# Patient Record
Sex: Female | Born: 1970 | Race: White | Hispanic: No | Smoking: Never smoker
Health system: Southern US, Community
[De-identification: ages and names within clinical notes are randomized; demographics above are authoritative.]

## PROBLEM LIST (undated history)

## (undated) DIAGNOSIS — D649 Anemia, unspecified: Secondary | ICD-10-CM

## (undated) DIAGNOSIS — F329 Major depressive disorder, single episode, unspecified: Secondary | ICD-10-CM

## (undated) DIAGNOSIS — R112 Nausea with vomiting, unspecified: Secondary | ICD-10-CM

## (undated) DIAGNOSIS — F32A Depression, unspecified: Secondary | ICD-10-CM

## (undated) DIAGNOSIS — M1990 Unspecified osteoarthritis, unspecified site: Secondary | ICD-10-CM

## (undated) DIAGNOSIS — Z9889 Other specified postprocedural states: Secondary | ICD-10-CM

## (undated) DIAGNOSIS — I10 Essential (primary) hypertension: Secondary | ICD-10-CM

## (undated) HISTORY — PX: BREAST REDUCTION SURGERY: SHX8

---

## 1998-10-04 ENCOUNTER — Inpatient Hospital Stay (HOSPITAL_COMMUNITY): Admission: AD | Admit: 1998-10-04 | Discharge: 1998-10-06 | Payer: Self-pay | Admitting: *Deleted

## 1998-10-08 ENCOUNTER — Encounter (HOSPITAL_COMMUNITY): Admission: RE | Admit: 1998-10-08 | Discharge: 1999-01-06 | Payer: Self-pay | Admitting: *Deleted

## 1998-11-09 ENCOUNTER — Other Ambulatory Visit: Admission: RE | Admit: 1998-11-09 | Discharge: 1998-11-09 | Payer: Self-pay | Admitting: *Deleted

## 2000-01-30 ENCOUNTER — Other Ambulatory Visit: Admission: RE | Admit: 2000-01-30 | Discharge: 2000-01-30 | Payer: Self-pay | Admitting: *Deleted

## 2001-01-30 ENCOUNTER — Other Ambulatory Visit: Admission: RE | Admit: 2001-01-30 | Discharge: 2001-01-30 | Payer: Self-pay | Admitting: *Deleted

## 2003-05-03 ENCOUNTER — Other Ambulatory Visit: Admission: RE | Admit: 2003-05-03 | Discharge: 2003-05-03 | Payer: Self-pay | Admitting: Obstetrics and Gynecology

## 2009-03-23 ENCOUNTER — Encounter: Admission: RE | Admit: 2009-03-23 | Discharge: 2009-03-23 | Payer: Self-pay | Admitting: Obstetrics and Gynecology

## 2010-03-26 ENCOUNTER — Encounter: Admission: RE | Admit: 2010-03-26 | Discharge: 2010-03-26 | Payer: Self-pay | Admitting: Obstetrics and Gynecology

## 2011-02-26 ENCOUNTER — Other Ambulatory Visit: Payer: Self-pay | Admitting: Obstetrics and Gynecology

## 2011-02-26 DIAGNOSIS — Z1231 Encounter for screening mammogram for malignant neoplasm of breast: Secondary | ICD-10-CM

## 2011-03-28 ENCOUNTER — Ambulatory Visit
Admission: RE | Admit: 2011-03-28 | Discharge: 2011-03-28 | Disposition: A | Discharge status: Home / Self Care | Payer: 59 | Source: Ambulatory Visit | Attending: Obstetrics and Gynecology | Admitting: Obstetrics and Gynecology

## 2011-03-28 DIAGNOSIS — Z1231 Encounter for screening mammogram for malignant neoplasm of breast: Secondary | ICD-10-CM

## 2013-06-24 ENCOUNTER — Other Ambulatory Visit: Payer: Self-pay

## 2013-06-24 DIAGNOSIS — Z1231 Encounter for screening mammogram for malignant neoplasm of breast: Secondary | ICD-10-CM

## 2013-06-28 ENCOUNTER — Ambulatory Visit
Admission: RE | Admit: 2013-06-28 | Discharge: 2013-06-28 | Disposition: A | Discharge status: Home / Self Care | Payer: 59 | Source: Ambulatory Visit

## 2013-06-28 DIAGNOSIS — Z1231 Encounter for screening mammogram for malignant neoplasm of breast: Secondary | ICD-10-CM

## 2016-03-19 ENCOUNTER — Other Ambulatory Visit (INDEPENDENT_AMBULATORY_CARE_PROVIDER_SITE_OTHER): Payer: Self-pay | Admitting: Physician Assistant

## 2016-04-17 ENCOUNTER — Other Ambulatory Visit (INDEPENDENT_AMBULATORY_CARE_PROVIDER_SITE_OTHER): Payer: Self-pay | Admitting: Physician Assistant

## 2016-05-17 ENCOUNTER — Other Ambulatory Visit (INDEPENDENT_AMBULATORY_CARE_PROVIDER_SITE_OTHER): Payer: Self-pay | Admitting: Physician Assistant

## 2016-05-17 NOTE — Telephone Encounter (Signed)
Please advise 

## 2016-06-19 ENCOUNTER — Other Ambulatory Visit (INDEPENDENT_AMBULATORY_CARE_PROVIDER_SITE_OTHER): Payer: Self-pay | Admitting: Physician Assistant

## 2016-07-23 ENCOUNTER — Other Ambulatory Visit (INDEPENDENT_AMBULATORY_CARE_PROVIDER_SITE_OTHER): Payer: Self-pay | Admitting: Orthopaedic Surgery

## 2016-08-14 ENCOUNTER — Other Ambulatory Visit (INDEPENDENT_AMBULATORY_CARE_PROVIDER_SITE_OTHER): Payer: Self-pay | Admitting: Orthopaedic Surgery

## 2016-09-23 ENCOUNTER — Other Ambulatory Visit (INDEPENDENT_AMBULATORY_CARE_PROVIDER_SITE_OTHER): Payer: Self-pay | Admitting: Orthopaedic Surgery

## 2016-10-24 ENCOUNTER — Other Ambulatory Visit (INDEPENDENT_AMBULATORY_CARE_PROVIDER_SITE_OTHER): Payer: Self-pay | Admitting: Orthopaedic Surgery

## 2016-11-23 ENCOUNTER — Other Ambulatory Visit (INDEPENDENT_AMBULATORY_CARE_PROVIDER_SITE_OTHER): Payer: Self-pay | Admitting: Orthopaedic Surgery

## 2016-12-07 ENCOUNTER — Other Ambulatory Visit (INDEPENDENT_AMBULATORY_CARE_PROVIDER_SITE_OTHER): Payer: Self-pay | Admitting: Orthopaedic Surgery

## 2017-01-08 ENCOUNTER — Other Ambulatory Visit: Payer: Self-pay | Admitting: Obstetrics and Gynecology

## 2017-01-21 NOTE — Patient Instructions (Addendum)
Your procedure is scheduled on:  Monday, Sept. 10, 2018  Enter through the [employer] [facility] at: 11:30 AM  Pick up the phone at the desk and dial [phone].  Call this number if you have problems the morning of surgery: [phone].  Remember: Do NOT eat food:  After Midnight Sunday  Do NOT drink clear liquids after:  7:00 AM day of surgery  Take these medicines the morning of surgery with a SIP OF WATER:  None  Stop ALL herbal medications at this time  Do NOT smoke the day of surgery.  Do NOT wear jewelry (body piercing), metal hair clips/bobby pins, make-up, artifical eyelashes or nail polish. Do NOT wear lotions, powders, or perfumes.  You may wear deodorant. Do NOT shave for 48 hours prior to surgery. Do NOT bring valuables to the hospital. Contacts, dentures, or bridgework may not be worn into surgery.  Leave suitcase in car.  After surgery it may be brought to your room.  For patients admitted to the hospital, checkout time is 11:00 AM the day of discharge.  Bring a copy of your healthcare power of attorney and living will documents.

## 2017-01-22 ENCOUNTER — Encounter (HOSPITAL_COMMUNITY): Payer: Self-pay

## 2017-01-22 ENCOUNTER — Other Ambulatory Visit: Payer: Self-pay

## 2017-01-22 ENCOUNTER — Encounter (HOSPITAL_COMMUNITY)
Admission: RE | Admit: 2017-01-22 | Discharge: 2017-01-22 | Disposition: A | Discharge status: Home / Self Care | Payer: BLUE CROSS/BLUE SHIELD | Source: Ambulatory Visit | Attending: Obstetrics and Gynecology | Admitting: Obstetrics and Gynecology

## 2017-01-22 DIAGNOSIS — Z01818 Encounter for other preprocedural examination: Secondary | ICD-10-CM | POA: Diagnosis present

## 2017-01-22 HISTORY — DX: Anemia, unspecified: D64.9

## 2017-01-22 HISTORY — DX: Essential (primary) hypertension: I10

## 2017-01-22 HISTORY — DX: Unspecified osteoarthritis, unspecified site: M19.90

## 2017-01-22 HISTORY — DX: Other specified postprocedural states: R11.2

## 2017-01-22 HISTORY — DX: Depression, unspecified: F32.A

## 2017-01-22 HISTORY — DX: Other specified postprocedural states: Z98.890

## 2017-01-22 HISTORY — DX: Major depressive disorder, single episode, unspecified: F32.9

## 2017-01-22 LAB — CBC
HEMATOCRIT: 40.8 % (ref 36.0–46.0)
HEMOGLOBIN: 13.3 g/dL (ref 12.0–15.0)
MCH: 29.4 pg (ref 26.0–34.0)
MCHC: 32.6 g/dL (ref 30.0–36.0)
MCV: 90.1 fL (ref 78.0–100.0)
Platelets: 421 10*3/uL — ABNORMAL HIGH (ref 150–400)
RBC: 4.53 MIL/uL (ref 3.87–5.11)
RDW: 13.8 % (ref 11.5–15.5)
WBC: 7 10*3/uL (ref 4.0–10.5)

## 2017-01-22 LAB — TYPE AND SCREEN
ABO/RH(D): A POS
ANTIBODY SCREEN: NEGATIVE

## 2017-01-22 LAB — BASIC METABOLIC PANEL
ANION GAP: 10 (ref 5–15)
BUN: 17 mg/dL (ref 6–20)
CALCIUM: 9.2 mg/dL (ref 8.9–10.3)
CO2: 24 mmol/L (ref 22–32)
Chloride: 103 mmol/L (ref 101–111)
Creatinine, Ser: 0.8 mg/dL (ref 0.44–1.00)
GFR calc Af Amer: >60 mL/min (ref >60)
Glucose, Bld: 116 mg/dL — ABNORMAL HIGH (ref 65–99)
POTASSIUM: 4.8 mmol/L (ref 3.5–5.1)
SODIUM: 137 mmol/L (ref 135–145)

## 2017-01-22 LAB — ABO/RH: ABO/RH(D): A POS

## 2017-01-27 ENCOUNTER — Encounter (HOSPITAL_COMMUNITY)
Admission: AD | Disposition: A | Discharge status: Home / Self Care | Payer: Self-pay | Source: Ambulatory Visit | Attending: Obstetrics and Gynecology

## 2017-01-27 ENCOUNTER — Ambulatory Visit (HOSPITAL_COMMUNITY)
Admission: AD | Admit: 2017-01-27 | Discharge: 2017-01-28 | Disposition: A | Discharge status: Home / Self Care | Payer: BLUE CROSS/BLUE SHIELD | Source: Ambulatory Visit | Attending: Obstetrics and Gynecology | Admitting: Obstetrics and Gynecology

## 2017-01-27 ENCOUNTER — Ambulatory Visit (HOSPITAL_COMMUNITY): Payer: BLUE CROSS/BLUE SHIELD | Admitting: Anesthesiology

## 2017-01-27 ENCOUNTER — Encounter (HOSPITAL_COMMUNITY): Payer: Self-pay | Admitting: Anesthesiology

## 2017-01-27 DIAGNOSIS — Z888 Allergy status to other drugs, medicaments and biological substances status: Secondary | ICD-10-CM | POA: Diagnosis not present

## 2017-01-27 DIAGNOSIS — Z6841 Body Mass Index (BMI) 40.0 and over, adult: Secondary | ICD-10-CM | POA: Insufficient documentation

## 2017-01-27 DIAGNOSIS — N841 Polyp of cervix uteri: Secondary | ICD-10-CM | POA: Insufficient documentation

## 2017-01-27 DIAGNOSIS — D259 Leiomyoma of uterus, unspecified: Secondary | ICD-10-CM | POA: Insufficient documentation

## 2017-01-27 DIAGNOSIS — Z833 Family history of diabetes mellitus: Secondary | ICD-10-CM | POA: Insufficient documentation

## 2017-01-27 DIAGNOSIS — N920 Excessive and frequent menstruation with regular cycle: Secondary | ICD-10-CM | POA: Insufficient documentation

## 2017-01-27 DIAGNOSIS — Z79899 Other long term (current) drug therapy: Secondary | ICD-10-CM | POA: Insufficient documentation

## 2017-01-27 DIAGNOSIS — I10 Essential (primary) hypertension: Secondary | ICD-10-CM | POA: Insufficient documentation

## 2017-01-27 DIAGNOSIS — N888 Other specified noninflammatory disorders of cervix uteri: Secondary | ICD-10-CM | POA: Insufficient documentation

## 2017-01-27 DIAGNOSIS — Z8249 Family history of ischemic heart disease and other diseases of the circulatory system: Secondary | ICD-10-CM | POA: Insufficient documentation

## 2017-01-27 DIAGNOSIS — Z91048 Other nonmedicinal substance allergy status: Secondary | ICD-10-CM | POA: Insufficient documentation

## 2017-01-27 DIAGNOSIS — N736 Female pelvic peritoneal adhesions (postinfective): Secondary | ICD-10-CM | POA: Insufficient documentation

## 2017-01-27 DIAGNOSIS — N921 Excessive and frequent menstruation with irregular cycle: Secondary | ICD-10-CM | POA: Diagnosis present

## 2017-01-27 DIAGNOSIS — K469 Unspecified abdominal hernia without obstruction or gangrene: Secondary | ICD-10-CM | POA: Diagnosis not present

## 2017-01-27 HISTORY — PX: ROBOTIC ASSISTED TOTAL HYSTERECTOMY WITH SALPINGECTOMY: SHX6679

## 2017-01-27 SURGERY — ROBOTIC ASSISTED TOTAL HYSTERECTOMY WITH SALPINGECTOMY
Anesthesia: General | Site: Abdomen | Laterality: Bilateral

## 2017-01-27 MED ORDER — BUPIVACAINE HCL (PF) 0.25 % IJ SOLN
INTRAMUSCULAR | Status: AC
Start: 2017-01-27 — End: 2017-01-27
  Filled 2017-01-27: qty 30

## 2017-01-27 MED ORDER — LOSARTAN POTASSIUM 50 MG PO TABS
50.0000 mg | ORAL_TABLET | Freq: Every day | ORAL | Status: DC
  Administered 2017-01-27: 50 mg via ORAL
  Filled 2017-01-27: qty 1

## 2017-01-27 MED ORDER — SCOPOLAMINE 1 MG/3DAYS TD PT72
MEDICATED_PATCH | TRANSDERMAL | Status: AC
  Administered 2017-01-27: 1.5 mg via TRANSDERMAL
  Filled 2017-01-27: qty 1

## 2017-01-27 MED ORDER — MIDAZOLAM HCL 2 MG/2ML IJ SOLN
INTRAMUSCULAR | Status: DC | PRN
  Administered 2017-01-27: 2 mg via INTRAVENOUS

## 2017-01-27 MED ORDER — HYDROMORPHONE HCL 1 MG/ML IJ SOLN
INTRAMUSCULAR | Status: DC | PRN
  Administered 2017-01-27: 1 mg via INTRAVENOUS

## 2017-01-27 MED ORDER — LIDOCAINE HCL (CARDIAC) 20 MG/ML IV SOLN
INTRAVENOUS | Status: DC | PRN
  Administered 2017-01-27: 100 mg via INTRAVENOUS

## 2017-01-27 MED ORDER — ROCURONIUM BROMIDE 100 MG/10ML IV SOLN
INTRAVENOUS | Status: DC | PRN
  Administered 2017-01-27: 10 mg via INTRAVENOUS
  Administered 2017-01-27: 50 mg via INTRAVENOUS
  Administered 2017-01-27: 10 mg via INTRAVENOUS

## 2017-01-27 MED ORDER — LIDOCAINE HCL (CARDIAC) 20 MG/ML IV SOLN
INTRAVENOUS | Status: AC
  Filled 2017-01-27: qty 5

## 2017-01-27 MED ORDER — HYDROMORPHONE 1 MG/ML IV SOLN
INTRAVENOUS | Status: DC
  Administered 2017-01-27: 17:00:00 via INTRAVENOUS
  Administered 2017-01-27: 0.6 mg via INTRAVENOUS
  Administered 2017-01-28: 0.4 mg via INTRAVENOUS
  Filled 2017-01-27: qty 25

## 2017-01-27 MED ORDER — ACETAMINOPHEN 160 MG/5ML PO SOLN
950.0000 mg | Freq: Once | ORAL | Status: AC
  Administered 2017-01-27: 950 mg via ORAL

## 2017-01-27 MED ORDER — EPHEDRINE SULFATE 50 MG/ML IJ SOLN
INTRAMUSCULAR | Status: DC | PRN
  Administered 2017-01-27 (×2): 5 mg via INTRAVENOUS

## 2017-01-27 MED ORDER — METOCLOPRAMIDE HCL 5 MG/ML IJ SOLN
INTRAMUSCULAR | Status: DC | PRN
  Administered 2017-01-27: 10 mg via INTRAVENOUS

## 2017-01-27 MED ORDER — CEFAZOLIN SODIUM-DEXTROSE 2-4 GM/100ML-% IV SOLN
2.0000 g | INTRAVENOUS | Status: AC
  Administered 2017-01-27: 2 g via INTRAVENOUS

## 2017-01-27 MED ORDER — DEXAMETHASONE SODIUM PHOSPHATE 10 MG/ML IJ SOLN
INTRAMUSCULAR | Status: AC
  Filled 2017-01-27: qty 1

## 2017-01-27 MED ORDER — CEFAZOLIN SODIUM-DEXTROSE 2-4 GM/100ML-% IV SOLN
INTRAVENOUS | Status: AC
Start: 2017-01-27 — End: 2017-01-27
  Filled 2017-01-27: qty 100

## 2017-01-27 MED ORDER — MIDAZOLAM HCL 2 MG/2ML IJ SOLN
INTRAMUSCULAR | Status: AC
  Filled 2017-01-27: qty 2

## 2017-01-27 MED ORDER — PHENYLEPHRINE 40 MCG/ML (10ML) SYRINGE FOR IV PUSH (FOR BLOOD PRESSURE SUPPORT)
PREFILLED_SYRINGE | INTRAVENOUS | Status: AC
  Filled 2017-01-27: qty 10

## 2017-01-27 MED ORDER — LACTATED RINGERS IV SOLN
INTRAVENOUS | Status: DC
  Administered 2017-01-27 (×2): via INTRAVENOUS

## 2017-01-27 MED ORDER — SERTRALINE HCL 100 MG PO TABS
100.0000 mg | ORAL_TABLET | Freq: Every day | ORAL | Status: DC
  Administered 2017-01-27: 100 mg via ORAL
  Filled 2017-01-27: qty 1

## 2017-01-27 MED ORDER — DEXTROSE IN LACTATED RINGERS 5 % IV SOLN
INTRAVENOUS | Status: DC
  Administered 2017-01-27: 17:00:00 via INTRAVENOUS

## 2017-01-27 MED ORDER — SUGAMMADEX SODIUM 200 MG/2ML IV SOLN
INTRAVENOUS | Status: AC
  Filled 2017-01-27: qty 2

## 2017-01-27 MED ORDER — PHENYLEPHRINE HCL 10 MG/ML IJ SOLN
INTRAMUSCULAR | Status: DC | PRN
  Administered 2017-01-27: 40 ug via INTRAVENOUS
  Administered 2017-01-27: 80 ug via INTRAVENOUS
  Administered 2017-01-27: 40 ug via INTRAVENOUS
  Administered 2017-01-27: 80 ug via INTRAVENOUS
  Administered 2017-01-27 (×2): 40 ug via INTRAVENOUS
  Administered 2017-01-27 (×2): 80 ug via INTRAVENOUS

## 2017-01-27 MED ORDER — SODIUM CHLORIDE 0.9 % IV SOLN
INTRAVENOUS | Status: DC | PRN
  Administered 2017-01-27: 60 mL
  Administered 2017-01-27: 14:00:00

## 2017-01-27 MED ORDER — ACETAMINOPHEN 160 MG/5ML PO SOLN
ORAL | Status: AC
  Filled 2017-01-27: qty 40.6

## 2017-01-27 MED ORDER — ATROPINE SULFATE 0.4 MG/ML IJ SOLN
INTRAMUSCULAR | Status: AC
  Filled 2017-01-27: qty 1

## 2017-01-27 MED ORDER — SCOPOLAMINE 1 MG/3DAYS TD PT72
1.0000 | MEDICATED_PATCH | Freq: Once | TRANSDERMAL | Status: DC
  Administered 2017-01-27: 1.5 mg via TRANSDERMAL

## 2017-01-27 MED ORDER — SODIUM CHLORIDE 0.9 % IJ SOLN
INTRAMUSCULAR | Status: AC
  Filled 2017-01-27: qty 40

## 2017-01-27 MED ORDER — GLYCOPYRROLATE 0.2 MG/ML IJ SOLN
INTRAMUSCULAR | Status: DC | PRN
  Administered 2017-01-27: .05 mg via INTRAVENOUS

## 2017-01-27 MED ORDER — DEXAMETHASONE SODIUM PHOSPHATE 4 MG/ML IJ SOLN
INTRAMUSCULAR | Status: DC | PRN
  Administered 2017-01-27: 10 mg via INTRAVENOUS

## 2017-01-27 MED ORDER — FENTANYL CITRATE (PF) 250 MCG/5ML IJ SOLN
INTRAMUSCULAR | Status: AC
  Filled 2017-01-27: qty 5

## 2017-01-27 MED ORDER — DIPHENHYDRAMINE HCL 12.5 MG/5ML PO ELIX: 12.5000 mg | ORAL_SOLUTION | Freq: Four times a day (QID) | ORAL | Status: DC | PRN

## 2017-01-27 MED ORDER — ONDANSETRON HCL 4 MG/2ML IJ SOLN
INTRAMUSCULAR | Status: AC
  Filled 2017-01-27: qty 2

## 2017-01-27 MED ORDER — HYDROMORPHONE HCL 1 MG/ML IJ SOLN
INTRAMUSCULAR | Status: AC
  Filled 2017-01-27: qty 1

## 2017-01-27 MED ORDER — DIPHENHYDRAMINE HCL 50 MG/ML IJ SOLN: 12.5000 mg | Freq: Four times a day (QID) | INTRAMUSCULAR | Status: DC | PRN

## 2017-01-27 MED ORDER — ROPIVACAINE HCL 5 MG/ML IJ SOLN
INTRAMUSCULAR | Status: AC
  Filled 2017-01-27: qty 30

## 2017-01-27 MED ORDER — EPHEDRINE 5 MG/ML INJ
INTRAVENOUS | Status: AC
  Filled 2017-01-27: qty 10

## 2017-01-27 MED ORDER — ONDANSETRON HCL 4 MG/2ML IJ SOLN: 4.0000 mg | Freq: Four times a day (QID) | INTRAMUSCULAR | Status: DC | PRN

## 2017-01-27 MED ORDER — FENTANYL CITRATE (PF) 250 MCG/5ML IJ SOLN
INTRAMUSCULAR | Status: DC | PRN
  Administered 2017-01-27 (×5): 50 ug via INTRAVENOUS

## 2017-01-27 MED ORDER — SUGAMMADEX SODIUM 200 MG/2ML IV SOLN
INTRAVENOUS | Status: DC | PRN
  Administered 2017-01-27: 230 mg via INTRAVENOUS

## 2017-01-27 MED ORDER — PROPOFOL 10 MG/ML IV BOLUS
INTRAVENOUS | Status: AC
  Filled 2017-01-27: qty 40

## 2017-01-27 MED ORDER — OXYCODONE-ACETAMINOPHEN 5-325 MG PO TABS
1.0000 | ORAL_TABLET | ORAL | Status: DC | PRN
  Administered 2017-01-28: 2 via ORAL
  Filled 2017-01-27: qty 2

## 2017-01-27 MED ORDER — TRAMADOL HCL 50 MG PO TABS: 50.0000 mg | ORAL_TABLET | Freq: Four times a day (QID) | ORAL | Status: DC | PRN

## 2017-01-27 MED ORDER — BUPIVACAINE HCL (PF) 0.25 % IJ SOLN
INTRAMUSCULAR | Status: DC | PRN
  Administered 2017-01-27: 10 mL

## 2017-01-27 MED ORDER — ONDANSETRON HCL 4 MG/2ML IJ SOLN
INTRAMUSCULAR | Status: DC | PRN
Start: 2017-01-27 — End: 2017-01-27
  Administered 2017-01-27: 4 mg via INTRAVENOUS

## 2017-01-27 MED ORDER — SODIUM CHLORIDE 0.9% FLUSH: 9.0000 mL | INTRAVENOUS | Status: DC | PRN

## 2017-01-27 MED ORDER — FENTANYL CITRATE (PF) 100 MCG/2ML IJ SOLN: 25.0000 ug | INTRAMUSCULAR | Status: DC | PRN

## 2017-01-27 MED ORDER — PROPOFOL 10 MG/ML IV BOLUS
INTRAVENOUS | Status: DC | PRN
  Administered 2017-01-27: 30 mg via INTRAVENOUS
  Administered 2017-01-27: 200 mg via INTRAVENOUS

## 2017-01-27 MED ORDER — NALOXONE HCL 0.4 MG/ML IJ SOLN: 0.4000 mg | INTRAMUSCULAR | Status: DC | PRN

## 2017-01-27 SURGICAL SUPPLY — 53 items
BARRIER ADHS 3X4 INTERCEED (GAUZE/BANDAGES/DRESSINGS) IMPLANT
CATH FOLEY 3WAY  5CC 16FR (CATHETERS) ×1
CATH FOLEY 3WAY 5CC 16FR (CATHETERS) ×1 IMPLANT
CLOTH BEACON ORANGE TIMEOUT ST (SAFETY) ×2 IMPLANT
CONT PATH 16OZ SNAP LID 3702 (MISCELLANEOUS) ×2 IMPLANT
COVER BACK TABLE 60X90IN (DRAPES) ×4 IMPLANT
COVER TIP SHEARS 8 DVNC (MISCELLANEOUS) ×1 IMPLANT
COVER TIP SHEARS 8MM DA VINCI (MISCELLANEOUS) ×1
DECANTER SPIKE VIAL GLASS SM (MISCELLANEOUS) ×6 IMPLANT
DERMABOND ADVANCED (GAUZE/BANDAGES/DRESSINGS) ×1
DERMABOND ADVANCED .7 DNX12 (GAUZE/BANDAGES/DRESSINGS) ×1 IMPLANT
DURAPREP 26ML APPLICATOR (WOUND CARE) ×2 IMPLANT
ELECT REM PT RETURN 9FT ADLT (ELECTROSURGICAL) ×2
ELECTRODE REM PT RTRN 9FT ADLT (ELECTROSURGICAL) ×1 IMPLANT
GAUZE VASELINE 3X9 (GAUZE/BANDAGES/DRESSINGS) IMPLANT
GLOVE BIO SURGEON STRL SZ7.5 (GLOVE) ×6 IMPLANT
GLOVE BIOGEL PI IND STRL 7.0 (GLOVE) ×2 IMPLANT
GLOVE BIOGEL PI INDICATOR 7.0 (GLOVE) ×2
KIT ACCESSORY DA VINCI DISP (KITS) ×1
KIT ACCESSORY DVNC DISP (KITS) ×1 IMPLANT
LEGGING LITHOTOMY PAIR STRL (DRAPES) ×2 IMPLANT
NEEDLE INSUFFLATION 150MM (ENDOMECHANICALS) ×2 IMPLANT
OCCLUDER COLPOPNEUMO (BALLOONS) ×2 IMPLANT
PACK ROBOT WH (CUSTOM PROCEDURE TRAY) ×2 IMPLANT
PACK ROBOTIC GOWN (GOWN DISPOSABLE) ×2 IMPLANT
PACK TRENDGUARD 450 HYBRID PRO (MISCELLANEOUS) IMPLANT
PACK TRENDGUARD 600 HYBRD PROC (MISCELLANEOUS) ×1 IMPLANT
PAD PREP 24X48 CUFFED NSTRL (MISCELLANEOUS) ×2 IMPLANT
POUCH LAPAROSCOPIC INSTRUMENT (MISCELLANEOUS) ×2 IMPLANT
PROTECTOR NERVE ULNAR (MISCELLANEOUS) ×4 IMPLANT
SET CYSTO W/LG BORE CLAMP LF (SET/KITS/TRAYS/PACK) IMPLANT
SET IRRIG TUBING LAPAROSCOPIC (IRRIGATION / IRRIGATOR) ×2 IMPLANT
SET TRI-LUMEN FLTR TB AIRSEAL (TUBING) ×2 IMPLANT
SUT VIC AB 0 CT1 27 (SUTURE) ×2
SUT VIC AB 0 CT1 27XBRD ANBCTR (SUTURE) ×2 IMPLANT
SUT VICRYL 0 UR6 27IN ABS (SUTURE) ×2 IMPLANT
SUT VICRYL RAPIDE 4/0 PS 2 (SUTURE) ×4 IMPLANT
SUT VLOC 180 0 9IN  GS21 (SUTURE) ×1
SUT VLOC 180 0 9IN GS21 (SUTURE) ×1 IMPLANT
SYR 50ML LL SCALE MARK (SYRINGE) ×2 IMPLANT
SYRINGE 10CC LL (SYRINGE) ×4 IMPLANT
TIP RUMI ORANGE 6.7MMX12CM (TIP) IMPLANT
TIP UTERINE 5.1X6CM LAV DISP (MISCELLANEOUS) IMPLANT
TIP UTERINE 6.7X10CM GRN DISP (MISCELLANEOUS) ×2 IMPLANT
TIP UTERINE 6.7X6CM WHT DISP (MISCELLANEOUS) IMPLANT
TIP UTERINE 6.7X8CM BLUE DISP (MISCELLANEOUS) IMPLANT
TOWEL OR 17X24 6PK STRL BLUE (TOWEL DISPOSABLE) ×6 IMPLANT
TRENDGUARD 450 HYBRID PRO PACK (MISCELLANEOUS)
TRENDGUARD 600 HYBRID PROC PK (MISCELLANEOUS) ×2
TROCAR DISP BLADELESS 8 DVNC (TROCAR) ×1 IMPLANT
TROCAR DISP BLADELESS 8MM (TROCAR) ×1
TROCAR PORT AIRSEAL 5X120 (TROCAR) ×2 IMPLANT
TROCAR Z-THREAD 12X150 (TROCAR) ×2 IMPLANT

## 2017-01-27 NOTE — Anesthesia Procedure Notes (Signed)
Procedure Name: Intubation Date/Time: 01/27/2017 12:52 PM Performed by: Flossie Dibble Pre-anesthesia Checklist: Patient identified, Patient being monitored, Timeout performed, Emergency Drugs available and Suction available Patient Re-evaluated:Patient Re-evaluated prior to induction Oxygen Delivery Method: Circle System Utilized Preoxygenation: Pre-oxygenation with 100% oxygen Induction Type: IV induction Ventilation: Mask ventilation without difficulty and Oral airway inserted - appropriate to patient size Laryngoscope Size: Mac and 3 Grade View: Grade I Tube type: Oral Tube size: 7.0 mm Number of attempts: 1 Airway Equipment and Method: stylet and Stylet Placement Confirmation: ETT inserted through vocal cords under direct vision,  positive ETCO2 and breath sounds checked- equal and bilateral Secured at: 21 cm Tube secured with: Tape Dental Injury: Teeth and Oropharynx as per pre-operative assessment

## 2017-01-27 NOTE — Transfer of Care (Signed)
Immediate Anesthesia Transfer of Care Note  Patient: Alison Mccarthy  Procedure(s) Performed: Procedure(s): ROBOTIC ASSISTED TOTAL HYSTERECTOMY WITH SALPINGECTOMY (Bilateral)  Patient Location: PACU  Anesthesia Type:General  Level of Consciousness: awake, alert  and oriented  Airway & Oxygen Therapy: Patient Spontanous Breathing and Patient connected to nasal cannula oxygen  Post-op Assessment: Report given to RN and Post -op Vital signs reviewed and stable  Post vital signs: Reviewed and stable  Last Vitals:  Vitals:   01/27/17 1153  BP: 130/88  Pulse: 86  Resp: 16  SpO2: 98%    Last Pain: There were no vitals filed for this visit.       Complications: No apparent anesthesia complications

## 2017-01-27 NOTE — Anesthesia Preprocedure Evaluation (Signed)
Anesthesia Evaluation  Patient identified by MRN, date of birth, ID band Patient awake    Reviewed: Allergy & Precautions, H&P , Patient's Chart, lab work & pertinent test results, reviewed documented beta blocker date and time   Airway Mallampati: II  TM Distance: >3 FB Neck ROM: full    Dental no notable dental hx.    Pulmonary    Pulmonary exam normal breath sounds clear to auscultation       Cardiovascular hypertension,  Rhythm:regular Rate:Normal     Neuro/Psych    GI/Hepatic   Endo/Other  Morbid obesity  Renal/GU      Musculoskeletal   Abdominal   Peds  Hematology   Anesthesia Other Findings   Reproductive/Obstetrics                             Anesthesia Physical Anesthesia Plan  ASA: III  Anesthesia Plan: General   Post-op Pain Management:    Induction: Intravenous  PONV Risk Score and Plan: 3 and Ondansetron, Dexamethasone, Midazolam and Scopolamine patch - Pre-op  Airway Management Planned: Oral ETT  Additional Equipment:   Intra-op Plan:   Post-operative Plan: Extubation in OR  Informed Consent: I have reviewed the patients History and Physical, chart, labs and discussed the procedure including the risks, benefits and alternatives for the proposed anesthesia with the patient or authorized representative who has indicated his/her understanding and acceptance.   Dental Advisory Given  Plan Discussed with: CRNA and Surgeon  Anesthesia Plan Comments: (  )        Anesthesia Quick Evaluation

## 2017-01-27 NOTE — H&P (Signed)
Alison Mccarthy, Alison Mccarthy                 ACCOUNT NO.:  [id number]  MEDICAL RECORD NO.:  [id number]  LOCATION:                                 FACILITY:  PHYSICIAN:  Lovenia Kim, M.D.     DATE OF BIRTH:  DATE OF ADMISSION:  01/27/2017 DATE OF DISCHARGE:                             HISTORY & PHYSICAL   CHIEF COMPLAINT:  Symptomatic fibroids.  HISTORY OF PRESENT ILLNESS:  This is a 46 year old white female G1, P0, with symptomatic fibroids, for definitive therapy.  MEDICATIONS:  Zoloft, losartan, vitamin, and Singulair as needed.  ALLERGIES:  To [place] Jelly, adhesive tape.  SOCIAL HISTORY:  Nonsmoker, nondrinker.  Denies domestic or physical violence.  FAMILY HISTORY:  Diabetes, chronic hypertension.  Personal surgical history of a breast reduction.  PHYSICAL EXAMINATION:  GENERAL:  Well-developed, well-nourished white female, in no acute distress. HEENT:  Normal. NECK:  Supple.  Full range of motion. LUNGS:  Clear. HEART:  Regular rate and rhythm. ABDOMEN:  Soft, nontender. PELVIC:  Bulky irregularly shaped anteflexed uterus and no adnexal masses. EXTREMITIES:  There are no cords. NEUROLOGIC:  Nonfocal. SKIN:  Intact.  IMPRESSION:  Symptomatic fibroids with menorrhagia, normal labs, history of hypertension.  Noncandidate for nonsurgical management.  The patient desires definitive therapy.  PLAN:  Da Vinci assisted total laparoscopic hysterectomy, bilateral salpingectomy.  Risks of anesthesia, infection, bleeding, injury to surrounding organs, and possible need for repair was discussed.  Delayed versus immediate complications to include bowel and bladder injury are noted.  The patient acknowledges and wishes to proceed.     Lovenia Kim, M.D.   ______________________________ Lovenia Kim, M.D.    RJT/MEDQ  D:  01/26/2017  T:  01/26/2017  Job:  [id number]  cc:   Lovenia Kim, M.D. Fax: [phone]

## 2017-01-27 NOTE — Anesthesia Postprocedure Evaluation (Signed)
Anesthesia Post Note  Patient: Alison Mccarthy  Procedure(s) Performed: Procedure(s) (LRB): ROBOTIC ASSISTED TOTAL HYSTERECTOMY WITH SALPINGECTOMY (Bilateral)     Patient location during evaluation: PACU Anesthesia Type: General Level of consciousness: awake and alert Pain management: pain level controlled Vital Signs Assessment: post-procedure vital signs reviewed and stable Respiratory status: spontaneous breathing, nonlabored ventilation, respiratory function stable and patient connected to nasal cannula oxygen Cardiovascular status: blood pressure returned to baseline and stable Postop Assessment: no signs of nausea or vomiting Anesthetic complications: no    Last Vitals:  Vitals:   01/27/17 1633 01/27/17 1650  BP:  (!) 103/52  Pulse: 89 91  Resp: 17 16  Temp: 37.1 C 36.9 C  SpO2: 99% 99%    Last Pain:  Vitals:   01/27/17 1650  TempSrc:   PainSc: 2    Pain Goal: Patients Stated Pain Goal: 5 (01/27/17 1650)               Arlando Leisinger EDWARD

## 2017-01-27 NOTE — Op Note (Signed)
01/27/2017  2:59 PM  PATIENT:  Alison Mccarthy  46 y.o. female  PRE-OPERATIVE DIAGNOSIS:  Fibroids, Menorrhagia  POST-OPERATIVE DIAGNOSIS:  Fibroids, Menorrhagia Enterocele Left adnexal adhesions  PROCEDURE:  Procedure(s): ROBOTIC ASSISTED TOTAL HYSTERECTOMY  BILATERAL SALPINGECTOMY MCCALL CUL DE PLASTY LYSIS OF ADHESIONS  SURGEON:  Surgeon(s): Brien Few, MD  ASSISTANTSMurrell Redden, MD  ANESTHESIA:   GENERAL, LOCAL  ESTIMATED BLOOD LOSS: 50  DRAINS: Urinary Catheter (Foley)   LOCAL MEDICATIONS USED:  MARCAINE    and Amount: 30 ml  SPECIMEN:  Source of Specimen:  UTERUS, CERVIX  DISPOSITION OF SPECIMEN:  PATHOLOGY  COUNTS:  YES  DICTATION [id number]  PLAN OF CARE: DC IN AM  PATIENT DISPOSITION:  PACU - hemodynamically stable.

## 2017-01-27 NOTE — Progress Notes (Signed)
Patient ID: Alison Mccarthy, female   DOB: [birth year], 46 y.o.   MRN: [id number] Patient seen and examined. Consent witnessed and signed. No changes noted. Update completed.

## 2017-01-27 NOTE — Progress Notes (Signed)
Review chart for  Chart audit.

## 2017-01-28 ENCOUNTER — Other Ambulatory Visit: Payer: Self-pay | Admitting: Obstetrics and Gynecology

## 2017-01-28 ENCOUNTER — Encounter (HOSPITAL_COMMUNITY): Payer: Self-pay | Admitting: Obstetrics and Gynecology

## 2017-01-28 DIAGNOSIS — D259 Leiomyoma of uterus, unspecified: Secondary | ICD-10-CM | POA: Diagnosis not present

## 2017-01-28 LAB — CBC
HEMATOCRIT: 35.3 % — AB (ref 36.0–46.0)
Hemoglobin: 11.6 g/dL — ABNORMAL LOW (ref 12.0–15.0)
MCH: 29.5 pg (ref 26.0–34.0)
MCHC: 32.9 g/dL (ref 30.0–36.0)
MCV: 89.8 fL (ref 78.0–100.0)
Platelets: 422 10*3/uL — ABNORMAL HIGH (ref 150–400)
RBC: 3.93 MIL/uL (ref 3.87–5.11)
RDW: 13.8 % (ref 11.5–15.5)
WBC: 10.3 10*3/uL (ref 4.0–10.5)

## 2017-01-28 LAB — BASIC METABOLIC PANEL
ANION GAP: 8 (ref 5–15)
BUN: 8 mg/dL (ref 6–20)
CALCIUM: 8.6 mg/dL — AB (ref 8.9–10.3)
CO2: 26 mmol/L (ref 22–32)
Chloride: 101 mmol/L (ref 101–111)
Creatinine, Ser: 0.77 mg/dL (ref 0.44–1.00)
GFR calc Af Amer: >60 mL/min (ref >60)
GFR calc non Af Amer: >60 mL/min (ref >60)
Glucose, Bld: 142 mg/dL — ABNORMAL HIGH (ref 65–99)
POTASSIUM: 4.1 mmol/L (ref 3.5–5.1)
Sodium: 135 mmol/L (ref 135–145)

## 2017-01-28 MED ORDER — TRAMADOL HCL 50 MG PO TABS: 50.0000 mg | ORAL_TABLET | Freq: Four times a day (QID) | ORAL | 0 refills | Status: DC | PRN

## 2017-01-28 MED ORDER — OXYCODONE-ACETAMINOPHEN 5-325 MG PO TABS: 1.0000 | ORAL_TABLET | ORAL | 0 refills | Status: DC | PRN

## 2017-01-28 NOTE — Addendum Note (Signed)
Addendum  created 01/28/17 9233 by Jonna Munro, CRNA   Sign clinical note

## 2017-01-28 NOTE — Anesthesia Postprocedure Evaluation (Signed)
Anesthesia Post Note  Patient: Alison Mccarthy  Procedure(s) Performed: Procedure(s) (LRB): ROBOTIC ASSISTED TOTAL HYSTERECTOMY WITH SALPINGECTOMY (Bilateral)     Patient location during evaluation: [facility] Anesthesia Type: General Level of consciousness: awake and alert and oriented Pain management: pain level controlled Vital Signs Assessment: post-procedure vital signs reviewed and stable Respiratory status: spontaneous breathing Cardiovascular status: stable Postop Assessment: no signs of nausea or vomiting and adequate PO intake Anesthetic complications: no    Last Vitals:  Vitals:   01/28/17 0540 01/28/17 0751  BP: 108/64 108/61  Pulse: 72 71  Resp: 19 18  Temp: 37.2 C 37 C  SpO2: 98% 98%    Last Pain:  Vitals:   01/28/17 0751  TempSrc: Oral  PainSc:    Pain Goal: Patients Stated Pain Goal: 3 (01/28/17 0745)               Willa Rough

## 2017-01-28 NOTE — Progress Notes (Signed)
1 Day Post-Op Procedure(s) (LRB): ROBOTIC ASSISTED TOTAL HYSTERECTOMY WITH SALPINGECTOMY (Bilateral)  Subjective: Patient reports nausea, incisional pain, tolerating PO, + flatus and no problems voiding.    Objective: BP 108/61 (BP Location: Left Arm)   Pulse 71   Temp 98.6 F (37 C) (Oral)   Resp 18   Ht 5\' 3"  (1.6 m)   Wt 115.2 kg (254 lb)   LMP 01/20/2017 (Exact Date)   SpO2 98%   BMI 44.99 kg/m   CBC    Component Value Date/Time   WBC 10.3 01/28/2017 0607   RBC 3.93 01/28/2017 0607   HGB 11.6 (L) 01/28/2017 0607   HCT 35.3 (L) 01/28/2017 0607   PLT 422 (H) 01/28/2017 0607   MCV 89.8 01/28/2017 0607   MCH 29.5 01/28/2017 0607   MCHC 32.9 01/28/2017 0607   RDW 13.8 01/28/2017 0607    I have reviewed patient's vital signs and labs.  General: alert Resp: clear to auscultation bilaterally and normal percussion bilaterally Cardio: regular rate and rhythm, S1, S2 normal, no murmur, click, rub or gallop and normal apical impulse GI: soft, non-tender; bowel sounds normal; no masses,  no organomegaly and incision: clean, dry and intact Extremities: extremities normal, atraumatic, no cyanosis or edema and Homans sign is negative, no sign of DVT Vaginal Bleeding: minimal  Assessment: s/p Procedure(s): ROBOTIC ASSISTED TOTAL HYSTERECTOMY WITH SALPINGECTOMY (Bilateral): stable, progressing well and tolerating diet  Plan: Advance diet Encourage ambulation Advance to PO medication Discontinue IV fluids Discharge home  LOS: 0 days    Sion Reinders J 01/28/2017, 8:24 AM

## 2017-01-28 NOTE — Progress Notes (Signed)
Pt ambulated out teaching complete  

## 2017-01-28 NOTE — Op Note (Signed)
NAMEMARYBEL, ALCOTT NO.:  [id number]  MEDICAL RECORD NO.:  [id number]  LOCATION:                                 FACILITY:  PHYSICIAN:  Lovenia Kim, M.D.     DATE OF BIRTH:  DATE OF PROCEDURE: DATE OF DISCHARGE:                              OPERATIVE REPORT   PREOPERATIVE DIAGNOSES: 1. Symptomatic fibroids. 2. Menometrorrhagia.  POSTOPERATIVE DIAGNOSES: 1. Symptomatic fibroids. 2. Menometrorrhagia. 3. Left adnexal adhesions and enterocele.  PROCEDURES: 1. Da Vinci assisted total laparoscopic hysterectomy. 2. Bilateral salpingectomy. 3. McCall culdoplasty. 4. Lysis of adhesions of left adnexa to sigmoid colon and subserosal     fibroid in anterior cervical area to bladder flap.  SURGEON:  Lovenia Kim, M.D.  ASSISTANTMurrell Redden.  ESTIMATED BLOOD LOSS:  50 mL.  COMPLICATIONS:  None.  DRAINS:  Foley.  COUNTS:  Correct.  DISPOSITION:  The patient to recovery in good condition.  SPECIMEN:  Uterus, cervix, and bilateral tubes to Pathology.  BRIEF OPERATIVE NOTE:  After being apprised of risks of anesthesia, infection, bleeding, injury to surrounding organs, possible need for repair, delayed versus immediate complications to include bowel and bladder injury with possible need for repair.  The patient was brought to the operating room.  She was administered a general anesthetic without complications.  Prepped and draped in usual sterile fashion. Foley catheter placed.  RUMI retractor placed vaginally with minimal difficulty.  At this time, infraumbilical incision made with a scalpel. Veress needle placed, opening pressure -2.  3.5 L CO2 insufflated without difficulty.  Trocar placed atraumatically.  One robotic trocar on the right.  AirSeal trocar and robotic trocar on the left. Visualization reveals a bulky uterus, bilateral normal ovaries, normal tubes.  Left ureter identified.  Left mesosalpinx undermined using monopolar cautery  __________.  The retroperitoneal space was entered on the left.  The left tuboovarian ligament was isolated and cut.  Ureters seen peristalsing along the medial leaf of the peritoneum.  The round ligament on the left was opened.  The uterine vessels on the left were skeletonized.  Upon skeletonization, it reveals, in the broad ligament, a large 3 cm subserosal intracervical fibroid projecting into the left adnexa.  This was skeletonized of its peritoneal attachments and effusions and the bladder flap was further developed on the distal to it.  At this time, the right mesosalpinx was undermined and divided. The tuboovarian ligament on the right was divided.  The round ligament was opened and the right uterine vessels were skeletonized.  Bladder flap was sharply developed down to the level of the cervicovaginal junction.  At this time, further adhesiolysis around the fibroid was done, so that the uterine vessels could be skeletonized and divided on the left and then again on the right using monopolar and bipolar cautery.  The cervicovaginal junction was identified and circumscribed in 360 degrees.  __________ fibroids are removed as well.  Vaginal cuff was closed in 2 running imbricating layers of a 0 V-Loc suture.  McCall culdoplasty suture was placed.  Good hemostasis noted.  Irrigation was accomplished.  Urine output was adequate.  Both ureters were seen peristalsing bilaterally.  Ovaries appeared normal.  CO2 was released after the robot was undocked and all trocars were removed under direct visualization.  Incisions were closed using 0 Vicryl, 4-0 Vicryl, and Dermabond.  Vaginal incision reveals the vaginal cuff to be well approximated.  The patient tolerated the procedure well.  She was awakened and transferred to the recovery room in good condition.     Lovenia Kim, M.D.     RJT/MEDQ  D:  01/27/2017  T:  01/28/2017  Job:  [id number]  cc:   [facility]

## 2017-02-13 ENCOUNTER — Other Ambulatory Visit (INDEPENDENT_AMBULATORY_CARE_PROVIDER_SITE_OTHER): Payer: Self-pay | Admitting: Orthopaedic Surgery

## 2017-03-01 ENCOUNTER — Other Ambulatory Visit (INDEPENDENT_AMBULATORY_CARE_PROVIDER_SITE_OTHER): Payer: Self-pay | Admitting: Orthopaedic Surgery

## 2017-03-31 ENCOUNTER — Ambulatory Visit (INDEPENDENT_AMBULATORY_CARE_PROVIDER_SITE_OTHER): Payer: BLUE CROSS/BLUE SHIELD

## 2017-03-31 ENCOUNTER — Encounter (INDEPENDENT_AMBULATORY_CARE_PROVIDER_SITE_OTHER): Payer: Self-pay | Admitting: Orthopaedic Surgery

## 2017-03-31 ENCOUNTER — Ambulatory Visit (INDEPENDENT_AMBULATORY_CARE_PROVIDER_SITE_OTHER): Payer: BLUE CROSS/BLUE SHIELD | Admitting: Orthopaedic Surgery

## 2017-03-31 DIAGNOSIS — G8929 Other chronic pain: Secondary | ICD-10-CM | POA: Diagnosis not present

## 2017-03-31 DIAGNOSIS — M25561 Pain in right knee: Secondary | ICD-10-CM

## 2017-03-31 DIAGNOSIS — M25562 Pain in left knee: Secondary | ICD-10-CM

## 2017-03-31 MED ORDER — NABUMETONE 500 MG PO TABS: 500.0000 mg | ORAL_TABLET | Freq: Two times a day (BID) | ORAL | 3 refills | Status: DC | PRN

## 2017-03-31 NOTE — Progress Notes (Signed)
Office Visit Note   Patient: Alison Mccarthy           Date of Birth: [birth year]           MRN: [id number] Visit Date: 03/31/2017              Requested by: [facility] [place], [place] [id number] PCP: [facility]   Assessment & Plan: Visit Diagnoses:  1. Chronic pain of both knees     Plan: Based on her continued right knee pain as well as locking and catching combined with the failure of all forms of conservative treatment including multiple steroid injections, anti-inflammatories, activity modification, questioning the exercises and time an MRI is warranted to assess the cartilage in the right meniscal tear.  We will see her back once the MRI is obtained.  She may eventually be a candidate for hyaluronic acid as well given her young age we need to know what the pathology is in her knee within know how best to treat this at this point given the failure of other forms of conservative treatment.  Since she has been on diclofenac for 2 years we will switch this to Relafen  Follow-Up Instructions: Return in about 2 weeks (around 04/14/2017).   Orders:  Orders Placed This Encounter  Procedures  . XR Knee 1-2 Views Left  . XR Knee 1-2 Views Right  . MR Knee Right w/o contrast   Meds ordered this encounter  Medications  . nabumetone (RELAFEN) 500 MG tablet    Sig: Take 1 tablet (500 mg total) 2 (two) times daily as needed by mouth.    Dispense:  60 tablet    Refill:  3      Procedures: No procedures performed   Clinical Data: No additional findings.   Subjective: Chief Complaint  Patient presents with  . Right Knee - Pain  . Left Knee - Pain   The patient comes in today with continued bilateral knee pain with right worse than left.  We have been seeing her for a long period of time for this and she has been on anti-inflammatories such as diclofenac for over 2 years now.  She is work on weight loss and activity  modification as well as quad strengthening exercises.  We have tried multiple injections of steroids in each knee.  She is here for x-rays of her knees today and for continued evaluation and treatment given the failure of conservative treatment.  She has been developing some locking catching in the right knee with tenderness along the medial joint line and the medial collateral ligaments where she points. HPI  Review of Systems She currently denies any headache, chest pain, shortness of breath, fever, chills, nausea, vomiting.  Objective: Vital Signs: There were no vitals taken for this visit.  Physical Exam She is alert and oriented x3 and in no acute distress Ortho Exam Examination of both knees show slight hyperextension.  She has significant patellofemoral crepitation of both knees.  Her right knee has pain over the medial collateral ligament and the medial joint line with stressing the McMurray sign causing pain. Specialty Comments:  No specialty comments available.  Imaging: Xr Knee 1-2 Views Left  Result Date: 03/31/2017 2 views of the left knee show slight medial compartment narrowing and some patellofemoral arthritic changes but overall no acute findings.  Xr Knee 1-2 Views Right  Result Date: 03/31/2017 2 views of the right knee show still well-maintained joint  space with some patellofemoral arthritic changes.  There is only slight medial joint space narrowing.    PMFS History: Patient Active Problem List   Diagnosis Date Noted  . Menorrhagia 01/27/2017   Past Medical History:  Diagnosis Date  . Anemia    history of   . Arthritis   . Depression   . Hypertension   . PONV (postoperative nausea and vomiting)     History reviewed. No pertinent family history.  Past Surgical History:  Procedure Laterality Date  . BREAST REDUCTION SURGERY     Social History   Occupational History  . Not on file  Tobacco Use  . Smoking status: Never Smoker  . Smokeless  tobacco: Never Used  Substance and Sexual Activity  . Alcohol use: No  . Drug use: No  . Sexual activity: Not on file

## 2017-04-12 ENCOUNTER — Ambulatory Visit
Admission: RE | Admit: 2017-04-12 | Discharge: 2017-04-12 | Disposition: A | Discharge status: Home / Self Care | Payer: BLUE CROSS/BLUE SHIELD | Source: Ambulatory Visit | Attending: Orthopaedic Surgery | Admitting: Orthopaedic Surgery

## 2017-04-12 DIAGNOSIS — G8929 Other chronic pain: Secondary | ICD-10-CM

## 2017-04-12 DIAGNOSIS — M25562 Pain in left knee: Principal | ICD-10-CM

## 2017-04-12 DIAGNOSIS — M25561 Pain in right knee: Principal | ICD-10-CM

## 2017-04-14 ENCOUNTER — Ambulatory Visit (INDEPENDENT_AMBULATORY_CARE_PROVIDER_SITE_OTHER): Payer: BLUE CROSS/BLUE SHIELD | Admitting: Orthopaedic Surgery

## 2017-04-14 ENCOUNTER — Encounter (INDEPENDENT_AMBULATORY_CARE_PROVIDER_SITE_OTHER): Payer: Self-pay | Admitting: Orthopaedic Surgery

## 2017-04-14 DIAGNOSIS — M25561 Pain in right knee: Secondary | ICD-10-CM | POA: Diagnosis not present

## 2017-04-14 DIAGNOSIS — G8929 Other chronic pain: Secondary | ICD-10-CM | POA: Diagnosis not present

## 2017-04-14 MED ORDER — LIDOCAINE HCL 1 % IJ SOLN
3.0000 mL | INTRAMUSCULAR | Status: AC | PRN
  Administered 2017-04-14: 3 mL

## 2017-04-14 MED ORDER — METHYLPREDNISOLONE ACETATE 40 MG/ML IJ SUSP
40.0000 mg | INTRAMUSCULAR | Status: AC | PRN
  Administered 2017-04-14: 40 mg via INTRA_ARTICULAR

## 2017-04-14 NOTE — Progress Notes (Signed)
Office Visit Note   Patient: Alison Mccarthy           Date of Birth: 1971/02/24           MRN: [id number] Visit Date: 04/14/2017              Requested by: [facility] [place], [place] [id number] PCP: [facility]   Assessment & Plan: Visit Diagnoses:  1. Chronic pain of right knee     Plan: I talked her about her MRI findings in length and we talked about activity modification and weight loss.  I do feel that she is a candidate for hyaluronic acid for her knee given the MRI findings.  I talked to her about trying a steroid injection today and I explained the risk and benefits of steroid injections and she agreed to this and tolerated it well.  In 4 weeks we will Place Synvisc 1 into her right knee.  All questions and concerns were answered and addressed.  Follow-Up Instructions: Return in about 4 weeks (around 05/12/2017).   Orders:  Orders Placed This Encounter  Procedures  . Large Joint Inj  . Large Joint Inj   No orders of the defined types were placed in this encounter.     Procedures: Large Joint Inj: R knee on 04/14/2017 8:28 AM Indications: diagnostic evaluation and pain Details: 22 G 1.5 in needle, superolateral approach  Arthrogram: No  Medications: 3 mL lidocaine 1 %; 40 mg methylPREDNISolone acetate 40 MG/ML Outcome: tolerated well, no immediate complications Procedure, treatment alternatives, risks and benefits explained, specific risks discussed. Consent was given by the patient. Immediately prior to procedure a time out was called to verify the correct patient, procedure, equipment, support staff and site/side marked as required. Patient was prepped and draped in the usual sterile fashion.       Clinical Data: No additional findings.   Subjective: Chief Complaint  Patient presents with  . Follow-up    MRI review  The patient is here for follow-up of MRI of her right knee she has a lot of medial  joint line tenderness that knee is just been getting worse and worse with time.  She has had multiple injections in that knee of a steroid but the last injection was about 2 years ago.  She is slightly overweight.  Her knee pain is detrimentally affected her activity living, quality of life, mobility.  Again and so along the medial aspect of her knee.  HPI  Review of Systems She currently denies any headache, chest pain, shortness of breath, fever, chills, nausea, vomiting.  Objective: Vital Signs: There were no vitals taken for this visit.  Physical Exam She is alert and oriented x3 and in no acute distress.  She does walk with a slight limp. Ortho Exam Examination of her right knee she has medial joint line tenderness throughout the medial compartment of her knee.  The remainder of the knee exam is normal. Specialty Comments:  No specialty comments available.  Imaging: No results found. The MRI is independently reviewed and it does not show a meniscal tear although it does show significant maceration of the meniscus given the medial joint space narrowing.  There is also thinning of the cartilage but no areas of full-thickness cartilage loss.  PMFS History: Patient Active Problem List   Diagnosis Date Noted  . Chronic pain of right knee 04/14/2017  . Menorrhagia 01/27/2017   Past Medical History:  Diagnosis  Date  . Anemia    history of   . Arthritis   . Depression   . Hypertension   . PONV (postoperative nausea and vomiting)     History reviewed. No pertinent family history.  Past Surgical History:  Procedure Laterality Date  . BREAST REDUCTION SURGERY    . ROBOTIC ASSISTED TOTAL HYSTERECTOMY WITH SALPINGECTOMY Bilateral 01/27/2017   Procedure: ROBOTIC ASSISTED TOTAL HYSTERECTOMY WITH SALPINGECTOMY;  Surgeon: Brien Few, MD;  Location: [facility];  Service: Gynecology;  Laterality: Bilateral;   Social History   Occupational History  . Not on file  Tobacco Use  .  Smoking status: Never Smoker  . Smokeless tobacco: Never Used  Substance and Sexual Activity  . Alcohol use: No  . Drug use: No  . Sexual activity: Not on file

## 2017-05-01 ENCOUNTER — Telehealth (INDEPENDENT_AMBULATORY_CARE_PROVIDER_SITE_OTHER): Payer: Self-pay | Admitting: Orthopaedic Surgery

## 2017-05-01 NOTE — Telephone Encounter (Signed)
Patient states her appointment on 12/27 is for knee injections (not cortisone), she cant remember what it was called and I dont see it in the appointment notes. But, the question she had was if she can drive after her appointment. Please advise - [phone]

## 2017-05-01 NOTE — Telephone Encounter (Signed)
Patient aware she can drive after injections

## 2017-05-14 ENCOUNTER — Telehealth (INDEPENDENT_AMBULATORY_CARE_PROVIDER_SITE_OTHER): Payer: Self-pay | Admitting: Orthopaedic Surgery

## 2017-05-14 NOTE — Telephone Encounter (Signed)
Patient wants to make sure that synvisc injections were ordered for both knees. She also wants to know if you know anything about the administration fee since her appt was changed from 12/27 to 01/07. She had met her deductible for 2018 and she was told she would not have to pay anything since they were ordered/paid for in 2018. I am also going try and schedule her for the 31st if anything comes available so there are no insurance problems. CB # [phone]

## 2017-05-14 NOTE — Telephone Encounter (Signed)
Appointment opened for 12/31 so I moved her there so hopefully she will not have any insurance problems. Thanks!

## 2017-05-15 ENCOUNTER — Ambulatory Visit (INDEPENDENT_AMBULATORY_CARE_PROVIDER_SITE_OTHER): Payer: BLUE CROSS/BLUE SHIELD | Admitting: Orthopaedic Surgery

## 2017-05-19 ENCOUNTER — Ambulatory Visit (INDEPENDENT_AMBULATORY_CARE_PROVIDER_SITE_OTHER): Payer: BLUE CROSS/BLUE SHIELD | Admitting: Orthopaedic Surgery

## 2017-05-19 ENCOUNTER — Encounter (INDEPENDENT_AMBULATORY_CARE_PROVIDER_SITE_OTHER): Payer: Self-pay | Admitting: Orthopaedic Surgery

## 2017-05-19 DIAGNOSIS — G8929 Other chronic pain: Secondary | ICD-10-CM | POA: Insufficient documentation

## 2017-05-19 DIAGNOSIS — M25561 Pain in right knee: Secondary | ICD-10-CM

## 2017-05-19 DIAGNOSIS — M1712 Unilateral primary osteoarthritis, left knee: Secondary | ICD-10-CM | POA: Insufficient documentation

## 2017-05-19 DIAGNOSIS — M1711 Unilateral primary osteoarthritis, right knee: Secondary | ICD-10-CM | POA: Diagnosis not present

## 2017-05-19 DIAGNOSIS — M25562 Pain in left knee: Principal | ICD-10-CM

## 2017-05-19 MED ORDER — HYLAN G-F 20 48 MG/6ML IX SOSY
48.0000 mg | PREFILLED_SYRINGE | INTRA_ARTICULAR | Status: AC | PRN
  Administered 2017-05-19: 48 mg via INTRA_ARTICULAR

## 2017-05-19 NOTE — Progress Notes (Signed)
   Procedure Note  Patient: Alison Mccarthy             Date of Birth: May 25, 1970           MRN: [id number]             Visit Date: 05/19/2017  Procedures: Visit Diagnoses: Chronic pain of left knee  Unilateral primary osteoarthritis, left knee  Unilateral primary osteoarthritis, right knee  Chronic pain of right knee  Large Joint Inj: R knee on 05/19/2017 5:00 PM Indications: pain and diagnostic evaluation Details: 22 G 1.5 in needle, superolateral approach  Arthrogram: No  Medications: 48 mg Hylan 48 MG/6ML Outcome: tolerated well, no immediate complications Procedure, treatment alternatives, risks and benefits explained, specific risks discussed. Consent was given by the patient. Immediately prior to procedure a time out was called to verify the correct patient, procedure, equipment, support staff and site/side marked as required. Patient was prepped and draped in the usual sterile fashion.   Large Joint Inj: L knee on 05/19/2017 5:01 PM Indications: pain and diagnostic evaluation Details: 22 G 1.5 in needle, superolateral approach  Arthrogram: No  Medications: 48 mg Hylan 48 MG/6ML Outcome: tolerated well, no immediate complications Procedure, treatment alternatives, risks and benefits explained, specific risks discussed. Consent was given by the patient. Immediately prior to procedure a time out was called to verify the correct patient, procedure, equipment, support staff and site/side marked as required. Patient was prepped and draped in the usual sterile fashion.    The patient is here today for bilateral Synvisc 1 injections in her knees.  She has advanced for age arthritis in the medial compartment of both knees but no full-thickness cartilage loss at all.  It is more of a moderate arthritic process.  She is already had steroid injections as well.  She is tolerated this well.  MRI of her right knee did show thinning of the cartilage in the medial compartment of her knee as  well as patellofemoral joint.  We a long and thorough discussion about hyaluronic acid injection in her knees and she understands this fully.  We placed injections in both knees and she tolerated these well.  All questions and concerns were answered and addressed.  She knows she can always have steroid injections 3 or 4 months down the road if needed.  She will otherwise follow-up as needed.

## 2017-05-26 ENCOUNTER — Ambulatory Visit (INDEPENDENT_AMBULATORY_CARE_PROVIDER_SITE_OTHER): Payer: BLUE CROSS/BLUE SHIELD | Admitting: Orthopaedic Surgery

## 2017-06-24 ENCOUNTER — Telehealth (INDEPENDENT_AMBULATORY_CARE_PROVIDER_SITE_OTHER): Payer: Self-pay | Admitting: Orthopaedic Surgery

## 2017-06-24 NOTE — Telephone Encounter (Signed)
Cortisone injections are fine for her to have, I am not sure of the cost though. She will have to check with her insurance  She just has to make sure its been 3 months since last cortisone, which I don't think it has been quite yet-maybe more by the end of the month

## 2017-06-24 NOTE — Telephone Encounter (Signed)
Patient said synvisc injection did not work for her she had in December. Dr. Ninfa Linden discussed cortisone injections for her if the synvisc didn't work. She wants to know when she'll be able to have the cortisone injection done and also if you know how much it would cost with her insurance since she hasnt met her deductible for the new year. Please advise # [phone]

## 2017-06-24 NOTE — Telephone Encounter (Signed)
Patient said she would call her insurance to ask about the cost, her appt for bilateral knee cortisone injections is made for 02/27.

## 2017-07-16 ENCOUNTER — Encounter (INDEPENDENT_AMBULATORY_CARE_PROVIDER_SITE_OTHER): Payer: Self-pay | Admitting: Orthopaedic Surgery

## 2017-07-16 ENCOUNTER — Ambulatory Visit (INDEPENDENT_AMBULATORY_CARE_PROVIDER_SITE_OTHER): Payer: BLUE CROSS/BLUE SHIELD | Admitting: Orthopaedic Surgery

## 2017-07-16 DIAGNOSIS — M1712 Unilateral primary osteoarthritis, left knee: Secondary | ICD-10-CM

## 2017-07-16 DIAGNOSIS — M1711 Unilateral primary osteoarthritis, right knee: Secondary | ICD-10-CM

## 2017-07-16 MED ORDER — CELECOXIB 200 MG PO CAPS
200.0000 mg | ORAL_CAPSULE | Freq: Every day | ORAL | 3 refills | Status: DC
Start: 2017-07-16 — End: 2017-10-16

## 2017-07-16 NOTE — Progress Notes (Signed)
Office Visit Note   Patient: Alison Mccarthy           Date of Birth: 1970-08-28           MRN: [id number] Visit Date: 07/16/2017              Requested by: [facility] [place], [place] [address] PCP: [facility]   Assessment & Plan: Visit Diagnoses:  1. Unilateral primary osteoarthritis, right knee   2. Unilateral primary osteoarthritis, left knee     Plan: Per her wishes we provide a steroid injection in both her knees.  At this point we will try Celebrex as an anti-inflammatory and still counseled her about quad strengthening exercises and weight loss.  We will see her back in about 6 weeks to see how she is doing overall.  She is now stating that her knees lock on occasion so we may even consider an arthroscopic intervention on her worst painful right knee if all else fails prior to thinking about knee replacement surgery because I do feel at a young age she would not tolerate it well.  Follow-Up Instructions: Return in about 6 weeks (around 08/27/2017).   Orders:  No orders of the defined types were placed in this encounter.  Meds ordered this encounter  Medications  . celecoxib (CELEBREX) 200 MG capsule    Sig: Take 1 capsule (200 mg total) by mouth daily.    Dispense:  30 capsule    Refill:  3      Procedures: Large Joint Inj: bilateral knee on 07/16/2017 5:23 PM Indications: diagnostic evaluation and pain Details: 22 G 1.5 in needle, superolateral approach  Arthrogram: No  Outcome: tolerated well, no immediate complications Procedure, treatment alternatives, risks and benefits explained, specific risks discussed. Consent was given by the patient. Immediately prior to procedure a time out was called to verify the correct patient, procedure, equipment, support staff and site/side marked as required. Patient was prepped and draped in the usual sterile fashion.       Clinical Data: No additional  findings.   Subjective: Chief Complaint  Patient presents with  . Right Knee - Follow-up  . Left Knee - Follow-up  The patient is a very pleasant 47 year old well-known to me.  She has known medial compartment arthritis of both her knees.  Her pain is daily and it wakes her up at night.  She is moderately obese.  She is tried everything she can in terms of activity modification and weight loss and anti-inflammatories to try to get her knee pain to less than.  We provided hyaluronic acid injections in her knees and she felt like that that was a waste of time and has not helped.  The pain wakes her up at night as well.  She like to consider maybe even a different anti-inflammatory would like a steroid injection today in both knees.  She is not a diabetic.  She currently denies any fever, chills, nausea, vomiting.  HPI  Review of Systems   Objective: Vital Signs: There were no vitals taken for this visit.  Physical Exam She is alert and oriented x3 and in no acute distress Ortho Exam Examination of both knees show medial joint line tenderness with no significant effusion.  Both knees slightly hyperextends. Specialty Comments:  No specialty comments available.  Imaging: No results found.   PMFS History: Patient Active Problem List   Diagnosis Date Noted  . Chronic pain of left  knee 05/19/2017  . Unilateral primary osteoarthritis, left knee 05/19/2017  . Unilateral primary osteoarthritis, right knee 05/19/2017  . Chronic pain of right knee 04/14/2017  . Menorrhagia 01/27/2017   Past Medical History:  Diagnosis Date  . Anemia    history of   . Arthritis   . Depression   . Hypertension   . PONV (postoperative nausea and vomiting)     History reviewed. No pertinent family history.  Past Surgical History:  Procedure Laterality Date  . BREAST REDUCTION SURGERY    . ROBOTIC ASSISTED TOTAL HYSTERECTOMY WITH SALPINGECTOMY Bilateral 01/27/2017   Procedure: ROBOTIC ASSISTED TOTAL  HYSTERECTOMY WITH SALPINGECTOMY;  Surgeon: Alison Few, MD;  Location: [facility];  Service: Gynecology;  Laterality: Bilateral;   Social History   Occupational History  . Not on file  Tobacco Use  . Smoking status: Never Smoker  . Smokeless tobacco: Never Used  Substance and Sexual Activity  . Alcohol use: No  . Drug use: No  . Sexual activity: Not on file

## 2017-08-27 ENCOUNTER — Encounter (INDEPENDENT_AMBULATORY_CARE_PROVIDER_SITE_OTHER): Payer: Self-pay | Admitting: Orthopaedic Surgery

## 2017-08-27 ENCOUNTER — Ambulatory Visit (INDEPENDENT_AMBULATORY_CARE_PROVIDER_SITE_OTHER): Payer: BLUE CROSS/BLUE SHIELD | Admitting: Orthopaedic Surgery

## 2017-08-27 DIAGNOSIS — S83203S Other tear of unspecified meniscus, current injury, right knee, sequela: Secondary | ICD-10-CM

## 2017-08-27 NOTE — Progress Notes (Signed)
Office Visit Note   Patient: Alison Mccarthy           Date of Birth: Jun 18, 1970           MRN: [id number] Visit Date: 08/27/2017              Requested by: [facility] [place], [place] [address] PCP: [facility]   Assessment & Plan: Visit Diagnoses:  1. Other tear of meniscus of right knee, unspecified meniscus, unspecified whether old or current tear, sequela     Plan: Due to the fact that she continues to have mechanical symptoms of the knee and pain in the knee despite conservative treatment at time recommend right knee arthroscopy with partial medial meniscectomy.  She will follow-up 1 week postop to have the sutures removed postoperative protocol reviewed with patient.  Risk benefits reviewed with patient.  Questions encouraged and answered at length.  Follow-Up Instructions: Return for post op 1 week.   Orders:  No orders of the defined types were placed in this encounter.  No orders of the defined types were placed in this encounter.     Procedures: No procedures performed   Clinical Data: No additional findings.   Subjective: Chief Complaint  Patient presents with  . Right Knee - Pain, Follow-up    HPI Alison Mccarthy returns today due to right knee pain.  She states that the right knee swells and occasionally locks especially whenever she goes to stand after sitting and also blocks whenever she has been standing for prolonged period of time.  She is tried Celebrex which helps some.  Is taking Tylenol PM.  She is doing some quad strengthening.  Pain is worse at night.  The cortisone injection she was given by Dr. Ninfa Linden lasted for about a week and a half and then her pain returned to the same level it was prior to the injection.  MRI dated 04/12/2017 did show partial degenerative maceration of the anterior horn medial meniscus and fraying along the free edge of the body of the medial meniscus.  Degenerative signal  was also noted within the posterior horn of the medial meniscus.  Medial compartment with thinning of the cartilage with joint space narrowing.  Patellofemoral mild thinning and irregularity of the cartilage. Review of Systems Please see HPI otherwise negative  Objective: Vital Signs: There were no vitals taken for this visit.  Physical Exam  Constitutional: She is oriented to person, place, and time. She appears well-developed and well-nourished. No distress.  Pulmonary/Chest: Effort normal.  Neurological: She is alert and oriented to person, place, and time.  Skin: She is not diaphoretic.  Psychiatric: She has a normal mood and affect.    Ortho Exam Right knee she has hyperextension patellofemoral crepitus is present.  Tenderness along medial joint line.  No effusion abnormal warmth or erythema. Specialty Comments:  No specialty comments available.  Imaging: No results found.   PMFS History: Patient Active Problem List   Diagnosis Date Noted  . Chronic pain of left knee 05/19/2017  . Unilateral primary osteoarthritis, left knee 05/19/2017  . Unilateral primary osteoarthritis, right knee 05/19/2017  . Chronic pain of right knee 04/14/2017  . Menorrhagia 01/27/2017   Past Medical History:  Diagnosis Date  . Anemia    history of   . Arthritis   . Depression   . Hypertension   . PONV (postoperative nausea and vomiting)     History reviewed. No pertinent family  history.  Past Surgical History:  Procedure Laterality Date  . BREAST REDUCTION SURGERY    . ROBOTIC ASSISTED TOTAL HYSTERECTOMY WITH SALPINGECTOMY Bilateral 01/27/2017   Procedure: ROBOTIC ASSISTED TOTAL HYSTERECTOMY WITH SALPINGECTOMY;  Surgeon: Brien Few, MD;  Location: [facility];  Service: Gynecology;  Laterality: Bilateral;   Social History   Occupational History  . Not on file  Tobacco Use  . Smoking status: Never Smoker  . Smokeless tobacco: Never Used  Substance and Sexual Activity  . Alcohol  use: No  . Drug use: No  . Sexual activity: Not on file

## 2017-10-16 ENCOUNTER — Encounter (INDEPENDENT_AMBULATORY_CARE_PROVIDER_SITE_OTHER): Payer: Self-pay | Admitting: Orthopaedic Surgery

## 2017-10-16 ENCOUNTER — Telehealth (INDEPENDENT_AMBULATORY_CARE_PROVIDER_SITE_OTHER): Payer: Self-pay | Admitting: Orthopedic Surgery

## 2017-10-16 ENCOUNTER — Ambulatory Visit (INDEPENDENT_AMBULATORY_CARE_PROVIDER_SITE_OTHER): Payer: BLUE CROSS/BLUE SHIELD | Admitting: Orthopaedic Surgery

## 2017-10-16 DIAGNOSIS — M1711 Unilateral primary osteoarthritis, right knee: Secondary | ICD-10-CM | POA: Diagnosis not present

## 2017-10-16 DIAGNOSIS — M1712 Unilateral primary osteoarthritis, left knee: Secondary | ICD-10-CM | POA: Diagnosis not present

## 2017-10-16 MED ORDER — LIDOCAINE HCL 1 % IJ SOLN
3.0000 mL | INTRAMUSCULAR | Status: AC | PRN
  Administered 2017-10-16: 3 mL

## 2017-10-16 MED ORDER — CELECOXIB 200 MG PO CAPS: 200.0000 mg | ORAL_CAPSULE | Freq: Two times a day (BID) | ORAL | 3 refills | Status: DC | PRN

## 2017-10-16 MED ORDER — METHYLPREDNISOLONE ACETATE 40 MG/ML IJ SUSP
40.0000 mg | INTRAMUSCULAR | Status: AC | PRN
  Administered 2017-10-16: 40 mg via INTRA_ARTICULAR

## 2017-10-16 NOTE — Telephone Encounter (Signed)
Disregard last message, wong patient

## 2017-10-16 NOTE — Telephone Encounter (Signed)
Chris from [employer] called wanting to relay a concern to Dr. Sharol Given on patients right foot. They are worried that from putting pressure on the right foot it may cause more damage and make the wound reopen. He would like to speak with you about this. CB # [phone] Acupuncturist #)  Personal # [phone]

## 2017-10-16 NOTE — Progress Notes (Signed)
Office Visit Note   Patient: Alison Mccarthy           Date of Birth: [birth year]           MRN: [id number] Visit Date: 10/16/2017              Requested by: [facility] [place], [place] [address] PCP: [facility]   Assessment & Plan: Visit Diagnoses:  1. Unilateral primary osteoarthritis, left knee   2. Unilateral primary osteoarthritis, right knee     Plan: Since the hyaluronic acid injections with no benefit and did not decrease her pain I do agree with trying steroid injections in both her knees today.  She understands the risk and benefits of these injections and tolerated them well.  All questions concerns were answered and addressed.  We will see her in 3 months to see how she is doing overall.  Follow-Up Instructions: Return in about 3 months (around 01/16/2018).   Orders:  Orders Placed This Encounter  Procedures  . Large Joint Inj  . Large Joint Inj   Meds ordered this encounter  Medications  . celecoxib (CELEBREX) 200 MG capsule    Sig: Take 1 capsule (200 mg total) by mouth 2 (two) times daily as needed.    Dispense:  60 capsule    Refill:  3      Procedures: Large Joint Inj: R knee on 10/16/2017 1:01 PM Indications: diagnostic evaluation and pain Details: 22 G 1.5 in needle, superolateral approach  Arthrogram: No  Medications: 3 mL lidocaine 1 %; 40 mg methylPREDNISolone acetate 40 MG/ML Outcome: tolerated well, no immediate complications Procedure, treatment alternatives, risks and benefits explained, specific risks discussed. Consent was given by the patient. Immediately prior to procedure a time out was called to verify the correct patient, procedure, equipment, support staff and site/side marked as required. Patient was prepped and draped in the usual sterile fashion.   Large Joint Inj: L knee on 10/16/2017 1:01 PM Indications: diagnostic evaluation and pain Details: 22 G 1.5 in needle, superolateral  approach  Arthrogram: No  Medications: 3 mL lidocaine 1 %; 40 mg methylPREDNISolone acetate 40 MG/ML Outcome: tolerated well, no immediate complications Procedure, treatment alternatives, risks and benefits explained, specific risks discussed. Consent was given by the patient. Immediately prior to procedure a time out was called to verify the correct patient, procedure, equipment, support staff and site/side marked as required. Patient was prepped and draped in the usual sterile fashion.       Clinical Data: No additional findings.   Subjective: Chief Complaint  Patient presents with  . Left Knee - Pain  . Right Knee - Pain  Patient is well-known to [place].  She has moderate arthritis of both her knees and is only 47 years old.  We have tried hyaluronic acid injections before the end of last year.  That did not really help her at all.  We considered a right knee arthroscopy to debride the knee but she is had to come in today for steroid shots in both knees today and still work on activity modification weight loss and quad strengthening exercises.  She still wants to consider knee arthroscopy at some point may be at the end of summer depending on her response with steroid injections.  HPI  Review of Systems She currently denies any headache, chest pain, shortness of breath, fever, chills, nausea, vomiting.  Objective: Vital Signs: There were no vitals taken for this visit.  Physical Exam She is alert and oriented x3 and in no acute distress Ortho Exam Both knees are examined and show painful medial joint lines.  There is stable range of motion and ligamentously stable knees.  The range of motion is full.  There is patellofemoral crepitation as well. Specialty Comments:  No specialty comments available.  Imaging: No results found.   PMFS History: Patient Active Problem List   Diagnosis Date Noted  . Chronic pain of left knee 05/19/2017  . Unilateral primary osteoarthritis,  left knee 05/19/2017  . Unilateral primary osteoarthritis, right knee 05/19/2017  . Chronic pain of right knee 04/14/2017  . Menorrhagia 01/27/2017   Past Medical History:  Diagnosis Date  . Anemia    history of   . Arthritis   . Depression   . Hypertension   . PONV (postoperative nausea and vomiting)     History reviewed. No pertinent family history.  Past Surgical History:  Procedure Laterality Date  . BREAST REDUCTION SURGERY    . ROBOTIC ASSISTED TOTAL HYSTERECTOMY WITH SALPINGECTOMY Bilateral 01/27/2017   Procedure: ROBOTIC ASSISTED TOTAL HYSTERECTOMY WITH SALPINGECTOMY;  Surgeon: Brien Few, MD;  Location: [facility];  Service: Gynecology;  Laterality: Bilateral;   Social History   Occupational History  . Not on file  Tobacco Use  . Smoking status: Never Smoker  . Smokeless tobacco: Never Used  Substance and Sexual Activity  . Alcohol use: No  . Drug use: No  . Sexual activity: Not on file

## 2018-01-21 ENCOUNTER — Ambulatory Visit (INDEPENDENT_AMBULATORY_CARE_PROVIDER_SITE_OTHER): Payer: BLUE CROSS/BLUE SHIELD | Admitting: Orthopaedic Surgery

## 2018-01-21 ENCOUNTER — Encounter (INDEPENDENT_AMBULATORY_CARE_PROVIDER_SITE_OTHER): Payer: Self-pay | Admitting: Orthopaedic Surgery

## 2018-01-21 DIAGNOSIS — M25562 Pain in left knee: Secondary | ICD-10-CM

## 2018-01-21 DIAGNOSIS — M94262 Chondromalacia, left knee: Secondary | ICD-10-CM

## 2018-01-21 DIAGNOSIS — M1712 Unilateral primary osteoarthritis, left knee: Secondary | ICD-10-CM

## 2018-01-21 DIAGNOSIS — M1711 Unilateral primary osteoarthritis, right knee: Secondary | ICD-10-CM

## 2018-01-21 DIAGNOSIS — M25561 Pain in right knee: Secondary | ICD-10-CM

## 2018-01-21 DIAGNOSIS — G8929 Other chronic pain: Secondary | ICD-10-CM

## 2018-01-21 MED ORDER — LIDOCAINE HCL 1 % IJ SOLN
3.0000 mL | INTRAMUSCULAR | Status: AC | PRN
  Administered 2018-01-21: 3 mL

## 2018-01-21 MED ORDER — METHYLPREDNISOLONE ACETATE 40 MG/ML IJ SUSP
40.0000 mg | INTRAMUSCULAR | Status: AC | PRN
  Administered 2018-01-21: 40 mg via INTRA_ARTICULAR

## 2018-01-21 NOTE — Progress Notes (Signed)
Office Visit Note   Patient: Alison Mccarthy           Date of Birth: 20-Apr-1971           MRN: [id number] Visit Date: 01/21/2018              Requested by: [facility] [place], [place] [address] PCP: [facility]   Assessment & Plan: Visit Diagnoses:  1. Unilateral primary osteoarthritis, left knee   2. Unilateral primary osteoarthritis, right knee   3. Chronic pain of right knee   4. Chronic pain of left knee   5. Chondromalacia of knee, left     Plan: At this juncture given the failure of all conservative treatment measures on both knees she does wish to proceed with an arthroscopic intervention of the left knee.  Hopefully at that visit we could perform a chondroplasty of mainly medial compartment of the patellofemoral joint as well as debride any inflamed tissue of the lining of the knee with a goal of her decreasing her pain and improving her mobility.  Hopefully this would then allow her to be more active to lose more weight to take pressure off her knees.  I have explained the risk and benefits of these types of surgeries and she understands this may only temporize her pain and it still the most important thing for her will be weight loss.  She will continue anti-inflammatories for now we will work on seeing if we can get her set up for a left knee arthroscopy at 1 of the medical centers in town.  Follow-Up Instructions: Return for 1 week post-op.   Orders:  Orders Placed This Encounter  Procedures  . Large Joint Inj  . Large Joint Inj   No orders of the defined types were placed in this encounter.     Procedures: Large Joint Inj: R knee on 01/21/2018 4:21 PM Indications: diagnostic evaluation and pain Details: 22 G 1.5 in needle, superolateral approach  Arthrogram: No  Medications: 3 mL lidocaine 1 %; 40 mg methylPREDNISolone acetate 40 MG/ML Outcome: tolerated well, no immediate complications Procedure, treatment  alternatives, risks and benefits explained, specific risks discussed. Consent was given by the patient. Immediately prior to procedure a time out was called to verify the correct patient, procedure, equipment, support staff and site/side marked as required. Patient was prepped and draped in the usual sterile fashion.   Large Joint Inj: L knee on 01/21/2018 4:21 PM Indications: diagnostic evaluation and pain Details: 22 G 1.5 in needle, superolateral approach  Arthrogram: No  Medications: 3 mL lidocaine 1 %; 40 mg methylPREDNISolone acetate 40 MG/ML Outcome: tolerated well, no immediate complications Procedure, treatment alternatives, risks and benefits explained, specific risks discussed. Consent was given by the patient. Immediately prior to procedure a time out was called to verify the correct patient, procedure, equipment, support staff and site/side marked as required. Patient was prepped and draped in the usual sterile fashion.       Clinical Data: No additional findings.   Subjective: Chief Complaint  Patient presents with  . Right Knee - Follow-up  The patient is here today with continued bilateral knee pain which now is the left worse than the right.  She is someone that is morbidly obese with a body mass index of 45.  We have MRI her right knee before which showed advanced for age arthritic changes in the medial compartment of her knee.  This issue with her  left knee as well.  We have tried steroid injections and hyaluronic acid injection in her knees.  She is work on activity modification and weight loss.  She is on Celebrex daily as well.  She is been having locking catching in both knees and her left knee hurts significantly bad.  We actually got her approved for a knee arthroscopy in the past but she had to cancel this due to financial issues.  At this point she does wish to proceed with a knee arthroscopy on her left knee given the failure of all conservative treatment  measures.  HPI  Review of Systems She currently denies any headache, chest pain, shortness of breath, fever, chills, nausea, vomiting.  Objective: Vital Signs: There were no vitals taken for this visit.  Physical Exam She is alert and oriented x3 and in no acute distress Ortho Exam Examination of both knees show significant obesity around both lower extremities.  She has medial tenderness along the joint line as well as patellofemoral crepitation of both knees. Specialty Comments:  No specialty comments available.  Imaging: No results found.   PMFS History: Patient Active Problem List   Diagnosis Date Noted  . Chronic pain of left knee 05/19/2017  . Unilateral primary osteoarthritis, left knee 05/19/2017  . Unilateral primary osteoarthritis, right knee 05/19/2017  . Chronic pain of right knee 04/14/2017  . Menorrhagia 01/27/2017   Past Medical History:  Diagnosis Date  . Anemia    history of   . Arthritis   . Depression   . Hypertension   . PONV (postoperative nausea and vomiting)     History reviewed. No pertinent family history.  Past Surgical History:  Procedure Laterality Date  . BREAST REDUCTION SURGERY    . ROBOTIC ASSISTED TOTAL HYSTERECTOMY WITH SALPINGECTOMY Bilateral 01/27/2017   Procedure: ROBOTIC ASSISTED TOTAL HYSTERECTOMY WITH SALPINGECTOMY;  Surgeon: Brien Few, MD;  Location: [facility];  Service: Gynecology;  Laterality: Bilateral;   Social History   Occupational History  . Not on file  Tobacco Use  . Smoking status: Never Smoker  . Smokeless tobacco: Never Used  Substance and Sexual Activity  . Alcohol use: No  . Drug use: No  . Sexual activity: Not on file

## 2018-02-26 ENCOUNTER — Other Ambulatory Visit (INDEPENDENT_AMBULATORY_CARE_PROVIDER_SITE_OTHER): Payer: Self-pay | Admitting: Orthopaedic Surgery

## 2018-02-26 NOTE — Telephone Encounter (Signed)
Please advise 

## 2018-04-22 ENCOUNTER — Ambulatory Visit (INDEPENDENT_AMBULATORY_CARE_PROVIDER_SITE_OTHER): Payer: Self-pay

## 2018-04-22 ENCOUNTER — Encounter (INDEPENDENT_AMBULATORY_CARE_PROVIDER_SITE_OTHER): Payer: Self-pay | Admitting: Orthopaedic Surgery

## 2018-04-22 ENCOUNTER — Ambulatory Visit (INDEPENDENT_AMBULATORY_CARE_PROVIDER_SITE_OTHER): Payer: BLUE CROSS/BLUE SHIELD | Admitting: Orthopaedic Surgery

## 2018-04-22 DIAGNOSIS — M1712 Unilateral primary osteoarthritis, left knee: Secondary | ICD-10-CM

## 2018-04-22 DIAGNOSIS — M1711 Unilateral primary osteoarthritis, right knee: Secondary | ICD-10-CM

## 2018-04-22 DIAGNOSIS — M25562 Pain in left knee: Secondary | ICD-10-CM | POA: Diagnosis not present

## 2018-04-22 DIAGNOSIS — M25561 Pain in right knee: Secondary | ICD-10-CM | POA: Diagnosis not present

## 2018-04-22 DIAGNOSIS — G8929 Other chronic pain: Secondary | ICD-10-CM

## 2018-04-22 NOTE — Progress Notes (Signed)
Office Visit Note   Patient: Alison Mccarthy           Date of Birth: February 21, 1971           MRN: [id number] Visit Date: 04/22/2018              Requested by: [facility] [place], [place] [id number] PCP: [facility]   Assessment & Plan: Visit Diagnoses:  1. Chronic pain of left knee   2. Chronic pain of right knee   3. Unilateral primary osteoarthritis, right knee   4. Unilateral primary osteoarthritis, left knee     Plan: At this point we have exhausted all treatment modalities and measures.  Her pain is daily and it is detrimentally affected her axis daily living, her mobility and her quality of life.  I absolutely feel comfortable with proceeding with a total knee arthroplasty on her right knee based on her clinical exam and x-ray findings.  She is very motivated.  She is not a diabetic and not a smoker and I do feel she will continue to lose weight as she is demonstrated the ability to do already.  Based on her body habitus I am also comfortable with proceeding with a right total knee arthroplasty.  We had a long and thorough discussion about the surgery today.  I explained her intraoperative and postoperative course and showed her knee model explained in detail what the surgery involves.  All question concerns were answered and addressed.  We will work on getting this scheduled sometime early next year which is just over a month from now.  Follow-Up Instructions: Return for 2 weeks post-op.   Orders:  Orders Placed This Encounter  Procedures  . XR Knee 1-2 Views Left  . XR Knee 1-2 Views Right   No orders of the defined types were placed in this encounter.     Procedures: No procedures performed   Clinical Data: No additional findings.   Subjective: Chief Complaint  Patient presents with  . Left Knee - Follow-up  . Right Knee - Follow-up  Patient is well-known to me.  She is a very pleasant 46 year old female that  we have been seeing for a long period of time for her right knee.  She had an MRI in 2018 showing advanced for age arthritic changes mainly in the medial compartment the knee.  She has had multiple injections of the year and this just not getting better and is actually getting worse now with locking catching.  She is been having some left knee pain as well.  She is someone with a BMI of 45.  She is not a diabetic and she is very active.  Her knees are not significantly obese.  At this point though her pain is 10 out of 10.  It is daily and it is detrimentally affected directives daily living, her quality of life and her mobility.  We will go and obtain new x-rays today because we have not x-rayed her knees in a while.  HPI  Review of Systems  She currently denies any headache, chest pain, short of breath, fever, chills, nausea, vomiting Objective: Vital Signs: There were no vitals taken for this visit.  Physical Exam She is alert oriented x3 and in no acute distress Ortho Exam Examination of both knees shows slight varus malalignment.  There is significant medial joint line tenderness bilaterally.  Both knees have varus malalignment that is easily correctable.  Both knees  feel ligamentously stable. Specialty Comments:  No specialty comments available.  Imaging: No results found.   PMFS History: Patient Active Problem List   Diagnosis Date Noted  . Chronic pain of left knee 05/19/2017  . Unilateral primary osteoarthritis, left knee 05/19/2017  . Unilateral primary osteoarthritis, right knee 05/19/2017  . Chronic pain of right knee 04/14/2017  . Menorrhagia 01/27/2017   Past Medical History:  Diagnosis Date  . Anemia    history of   . Arthritis   . Depression   . Hypertension   . PONV (postoperative nausea and vomiting)     History reviewed. No pertinent family history.  Past Surgical History:  Procedure Laterality Date  . BREAST REDUCTION SURGERY    . ROBOTIC ASSISTED TOTAL  HYSTERECTOMY WITH SALPINGECTOMY Bilateral 01/27/2017   Procedure: ROBOTIC ASSISTED TOTAL HYSTERECTOMY WITH SALPINGECTOMY;  Surgeon: Brien Few, MD;  Location: [facility];  Service: Gynecology;  Laterality: Bilateral;   Social History   Occupational History  . Not on file  Tobacco Use  . Smoking status: Never Smoker  . Smokeless tobacco: Never Used  Substance and Sexual Activity  . Alcohol use: No  . Drug use: No  . Sexual activity: Not on file

## 2018-06-29 ENCOUNTER — Other Ambulatory Visit (INDEPENDENT_AMBULATORY_CARE_PROVIDER_SITE_OTHER): Payer: Self-pay | Admitting: Orthopaedic Surgery

## 2018-06-30 NOTE — Telephone Encounter (Signed)
Please advise 

## 2018-07-22 ENCOUNTER — Encounter (INDEPENDENT_AMBULATORY_CARE_PROVIDER_SITE_OTHER): Payer: Self-pay | Admitting: Orthopaedic Surgery

## 2018-07-22 ENCOUNTER — Ambulatory Visit (INDEPENDENT_AMBULATORY_CARE_PROVIDER_SITE_OTHER): Payer: BLUE CROSS/BLUE SHIELD | Admitting: Orthopaedic Surgery

## 2018-07-22 DIAGNOSIS — M1712 Unilateral primary osteoarthritis, left knee: Secondary | ICD-10-CM

## 2018-07-22 DIAGNOSIS — M170 Bilateral primary osteoarthritis of knee: Secondary | ICD-10-CM

## 2018-07-22 DIAGNOSIS — M1711 Unilateral primary osteoarthritis, right knee: Secondary | ICD-10-CM

## 2018-07-22 MED ORDER — LIDOCAINE HCL 1 % IJ SOLN
0.5000 mL | INTRAMUSCULAR | Status: AC | PRN
  Administered 2018-07-22: .5 mL

## 2018-07-22 MED ORDER — METHYLPREDNISOLONE ACETATE 40 MG/ML IJ SUSP
40.0000 mg | INTRAMUSCULAR | Status: AC | PRN
  Administered 2018-07-22: 40 mg via INTRA_ARTICULAR

## 2018-07-22 NOTE — Progress Notes (Signed)
   Procedure Note  Patient: Alison Mccarthy             Date of Birth: 10-03-1970           MRN: [id number]             Visit Date: 07/22/2018 HPI: [name] is well-known to our department service has known osteoarthritis of both knees.  She is last given injections in December 2019 is comes in today requesting injection bilateral knees.  Right knee pain is worse than left.  She has had no new injury.  She is thinking about having right total knee replacement in the summer.  She is currently on Celebrex.  Review of systems: Denies any fevers chills.  Physical exam: Bilateral knees good range of motion.  No abnormal warmth or erythema.  Procedures: Visit Diagnoses: Unilateral primary osteoarthritis, right knee  Unilateral primary osteoarthritis, left knee  Large Joint Inj: bilateral knee on 07/22/2018 8:55 AM Indications: pain Details: 22 G 1.5 in needle, anterolateral approach  Arthrogram: No  Medications (Right): 0.5 mL lidocaine 1 %; 40 mg methylPREDNISolone acetate 40 MG/ML Medications (Left): 0.5 mL lidocaine 1 %; 40 mg methylPREDNISolone acetate 40 MG/ML Outcome: tolerated well, no immediate complications Procedure, treatment alternatives, risks and benefits explained, specific risks discussed. Consent was given by the patient. Immediately prior to procedure a time out was called to verify the correct patient, procedure, equipment, support staff and site/side marked as required. Patient was prepped and draped in the usual sterile fashion.     Plan: We will see her back as needed.  She is does request pain medicine approximately 3 months for repeat injections in both knees.  Questions encouraged and answered.

## 2018-07-28 ENCOUNTER — Other Ambulatory Visit (INDEPENDENT_AMBULATORY_CARE_PROVIDER_SITE_OTHER): Payer: Self-pay | Admitting: Orthopaedic Surgery

## 2018-07-29 NOTE — Telephone Encounter (Signed)
Ok to rf? 

## 2018-08-29 ENCOUNTER — Other Ambulatory Visit (INDEPENDENT_AMBULATORY_CARE_PROVIDER_SITE_OTHER): Payer: Self-pay | Admitting: Orthopaedic Surgery

## 2018-08-31 NOTE — Telephone Encounter (Signed)
Please advise 

## 2018-09-29 ENCOUNTER — Other Ambulatory Visit (INDEPENDENT_AMBULATORY_CARE_PROVIDER_SITE_OTHER): Payer: Self-pay | Admitting: Orthopaedic Surgery

## 2018-09-29 NOTE — Telephone Encounter (Signed)
Please advise. OK for refill? 

## 2018-10-26 ENCOUNTER — Ambulatory Visit (INDEPENDENT_AMBULATORY_CARE_PROVIDER_SITE_OTHER): Payer: BC Managed Care – PPO | Admitting: Physician Assistant

## 2018-10-26 ENCOUNTER — Encounter: Payer: Self-pay | Admitting: Physician Assistant

## 2018-10-26 ENCOUNTER — Other Ambulatory Visit: Payer: Self-pay

## 2018-10-26 DIAGNOSIS — M1712 Unilateral primary osteoarthritis, left knee: Secondary | ICD-10-CM

## 2018-10-26 DIAGNOSIS — M1711 Unilateral primary osteoarthritis, right knee: Secondary | ICD-10-CM | POA: Diagnosis not present

## 2018-10-26 MED ORDER — LIDOCAINE HCL 1 % IJ SOLN
0.5000 mL | INTRAMUSCULAR | Status: AC | PRN
  Administered 2018-10-26: .5 mL

## 2018-10-26 MED ORDER — METHYLPREDNISOLONE ACETATE 40 MG/ML IJ SUSP
40.0000 mg | INTRAMUSCULAR | Status: AC | PRN
  Administered 2018-10-26: 40 mg via INTRA_ARTICULAR

## 2018-10-26 MED ORDER — CELECOXIB 200 MG PO CAPS: 200.0000 mg | ORAL_CAPSULE | Freq: Two times a day (BID) | ORAL | 6 refills | Status: DC

## 2018-10-26 NOTE — Progress Notes (Signed)
Office Visit Note   Patient: Alison Mccarthy           Date of Birth: [birth year]           MRN: [id number] Visit Date: 10/26/2018              Requested by: [facility] [place], [place] [address] PCP: [facility]   Assessment & Plan: Visit Diagnoses:  1. Unilateral primary osteoarthritis, left knee   2. Unilateral primary osteoarthritis, right knee     Plan: At patient's request we will have her follow-up with [place] in 3 months for injections in both knees.  She will continue her Celebrex.  Continue her other natural anti-inflammatories.  Questions encouraged and answered.  Follow-Up Instructions: Return in about 3 months (around 01/26/2019).   Orders:  No orders of the defined types were placed in this encounter.  Meds ordered this encounter  Medications   celecoxib (CELEBREX) 200 MG capsule    Sig: Take 1 capsule (200 mg total) by mouth 2 (two) times daily.    Dispense:  60 capsule    Refill:  6      Procedures: Large Joint Inj: bilateral knee on 10/26/2018 9:20 AM Indications: pain Details: 22 G 1.5 in needle, anterolateral approach  Arthrogram: No  Medications (Right): 0.5 mL lidocaine 1 %; 40 mg methylPREDNISolone acetate 40 MG/ML Medications (Left): 0.5 mL lidocaine 1 %; 40 mg methylPREDNISolone acetate 40 MG/ML Outcome: tolerated well, no immediate complications Procedure, treatment alternatives, risks and benefits explained, specific risks discussed. Consent was given by the patient. Immediately prior to procedure a time out was called to verify the correct patient, procedure, equipment, support staff and site/side marked as required. Patient was prepped and draped in the usual sterile fashion.       Clinical Data: No additional findings.   Subjective: Chief Complaint  Patient presents with   Right Knee - Pain   Left Knee - Pain    HPI Alison Mccarthy returns today requesting injections in both knees.   Both knees were injected 07/22/2018 and has done well until the last couple weeks.  Pain is worse at night.  She continues to take her Celebrex she is also taking Tylenol arthritis using hemp oil and turmeric.  She has had no new injury to either knee.  Patient with known osteoarthritis of both knees.  Is not ready for any type of surgical intervention. Review of Systems Please see HPI otherwise negative or noncontributory.  Objective: Vital Signs: There were no vitals taken for this visit.  Physical Exam General: Well-developed well-nourished female no acute distress moodand affect normal.  Ortho Exam Bilateral knees: Good range of motion.  Slight varus deformity of both knees.  No abnormal warmth or erythema of either knee. Specialty Comments:  No specialty comments available.  Imaging: No results found.   PMFS History: Patient Active Problem List   Diagnosis Date Noted   Chronic pain of left knee 05/19/2017   Unilateral primary osteoarthritis, left knee 05/19/2017   Unilateral primary osteoarthritis, right knee 05/19/2017   Chronic pain of right knee 04/14/2017   Menorrhagia 01/27/2017   Past Medical History:  Diagnosis Date   Anemia    history of    Arthritis    Depression    Hypertension    PONV (postoperative nausea and vomiting)     History reviewed. No pertinent family history.  Past Surgical History:  Procedure Laterality Date   BREAST  REDUCTION SURGERY     ROBOTIC ASSISTED TOTAL HYSTERECTOMY WITH SALPINGECTOMY Bilateral 01/27/2017   Procedure: ROBOTIC ASSISTED TOTAL HYSTERECTOMY WITH SALPINGECTOMY;  Surgeon: Brien Few, MD;  Location: [facility];  Service: Gynecology;  Laterality: Bilateral;   Social History   Occupational History   Not on file  Tobacco Use   Smoking status: Never Smoker   Smokeless tobacco: Never Used  Substance and Sexual Activity   Alcohol use: No   Drug use: No   Sexual activity: Not on file

## 2018-11-14 IMAGING — MR MR KNEE*R* W/O CM
6 series · 37 of 40 positions shown · non-contrast
Comparison: None.

CLINICAL DATA: Chronic right knee pain centered about the patella.
No known injury.

EXAM:
MRI OF THE RIGHT KNEE WITHOUT CONTRAST
TECHNIQUE: Multiplanar, multisequence MR imaging of the knee was performed. No
intravenous contrast was administered.

[Series 3: PD fat-sat · axial · right · 4.0mm · 0.47mm/px · z∈[-83,+22]mm · 7 of 25 slices shown (1 of 3)]
[im 1/25]
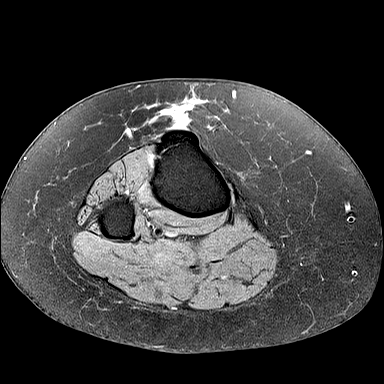
[im 5/25]
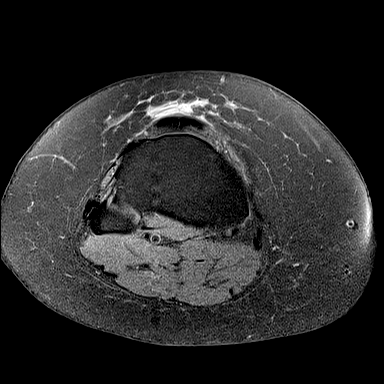
[im 9/25]
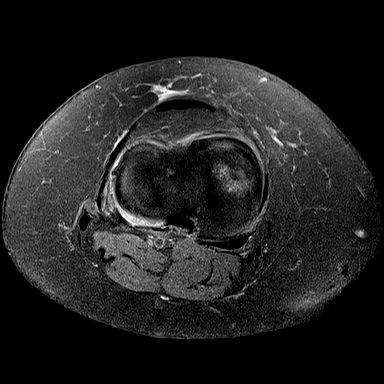
[im 13/25]
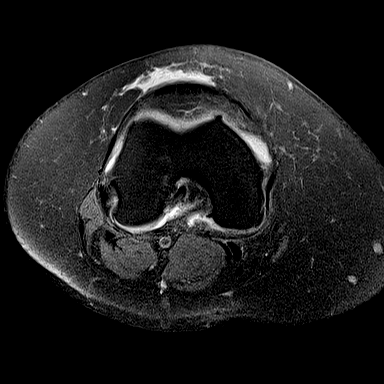
[im 17/25]
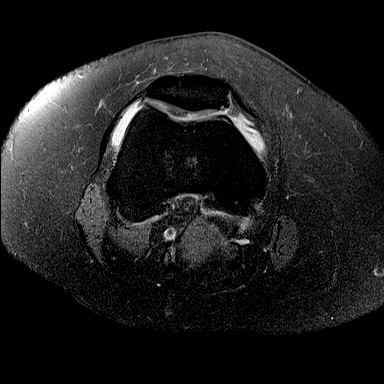
[im 21/25]
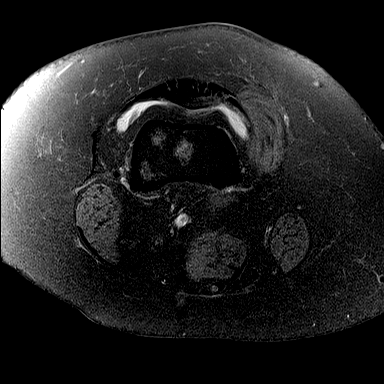
[im 25/25]
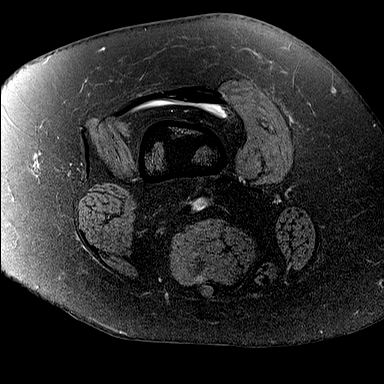

[Series 4: T1 · coronal · right · 3.0mm · 0.47mm/px · 4 of 31 slices shown]
[im 1/31]
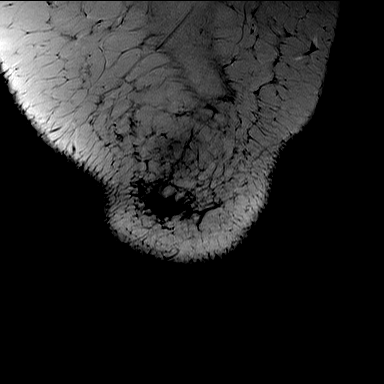
[im 6/31]
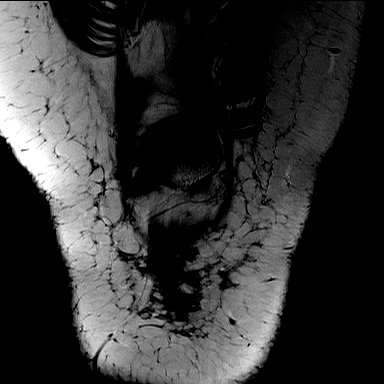
[im 11/31]
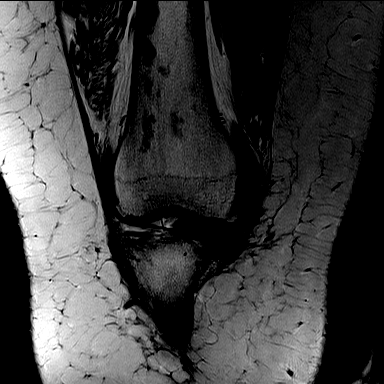
[im 16/31]
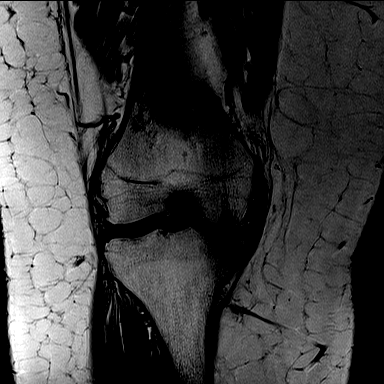

[Series 5: PD fat-sat · coronal · right · 3.0mm · 0.47mm/px · 7 of 31 slices shown (2 of 3)]
[im 1/31]
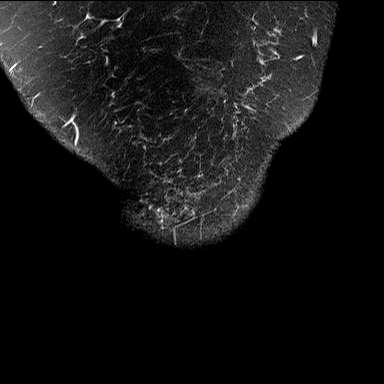
[im 6/31]
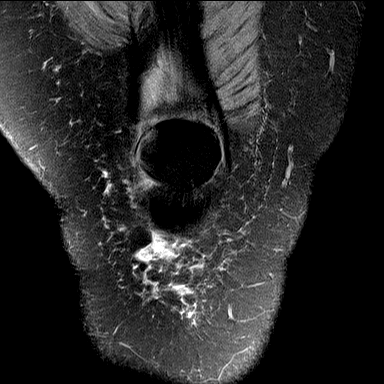
[im 11/31]
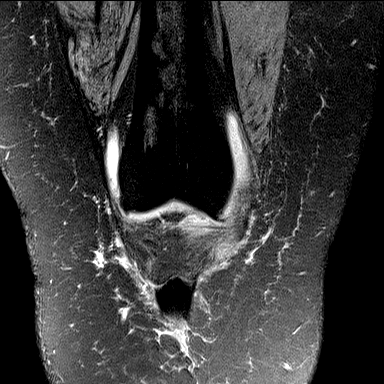
[im 16/31]
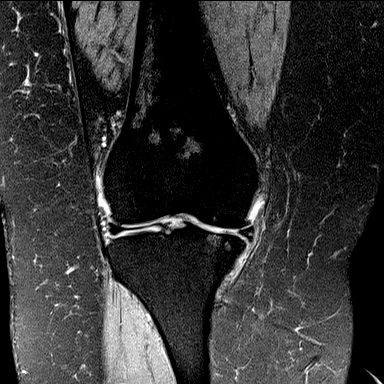
[im 21/31]
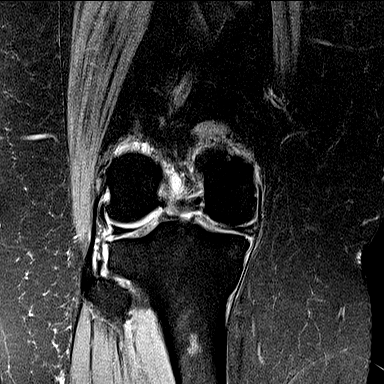
[im 26/31]
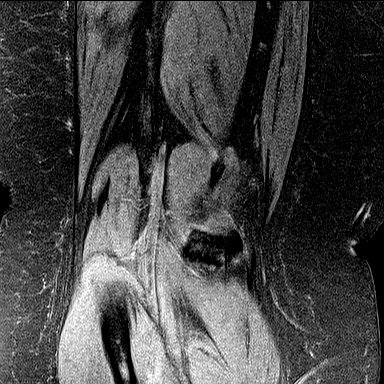
[im 31/31]
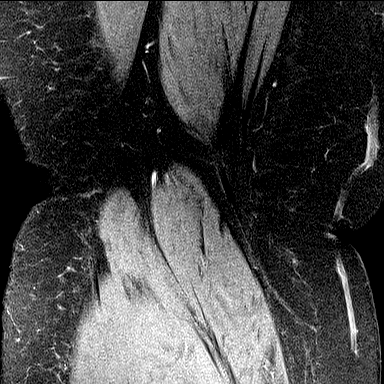

[Series 6: T2 fat-sat · coronal · right · 3.0mm · 0.47mm/px · 7 of 31 slices shown]
[im 1/31]
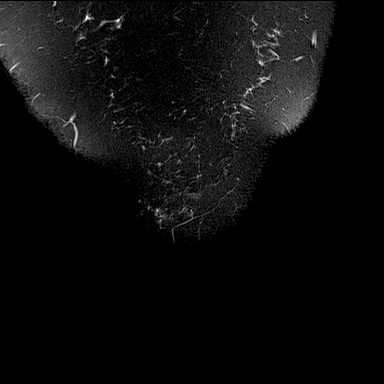
[im 6/31]
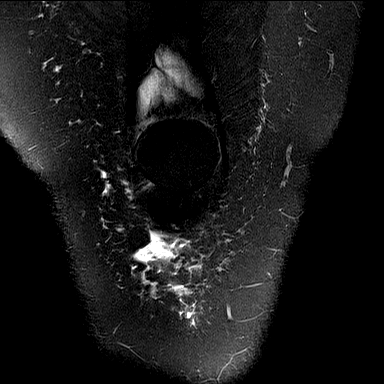
[im 11/31]
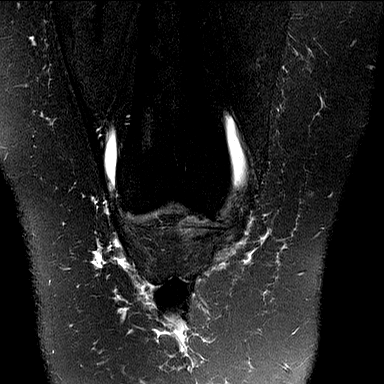
[im 16/31]
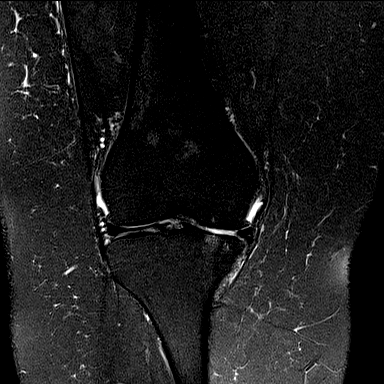
[im 21/31]
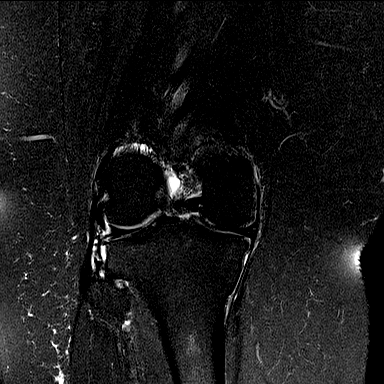
[im 26/31]
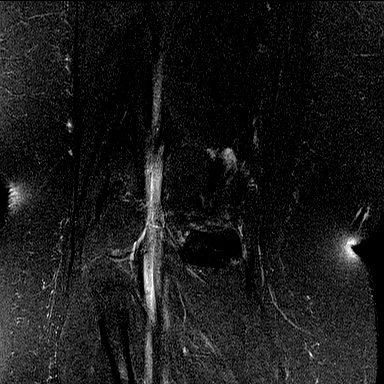
[im 31/31]
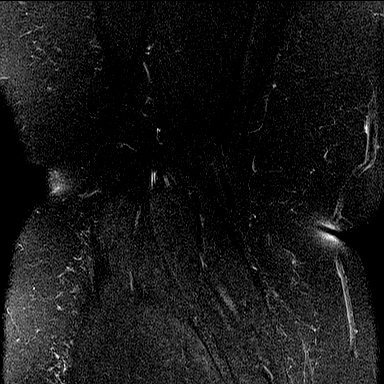

[Series 7: PD fat-sat · sagittal · right · 3.2mm · 0.56mm/px · 7 of 27 slices shown (3 of 3)]
[im 1/27]
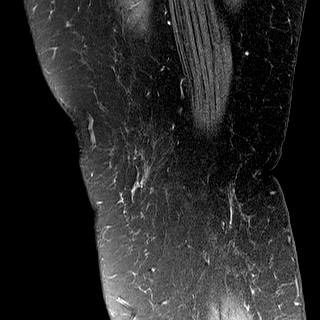
[im 5/27]
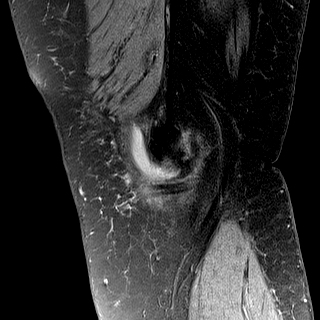
[im 9/27]
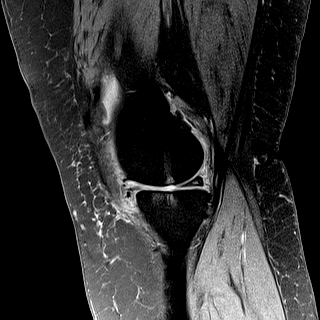
[im 14/27]
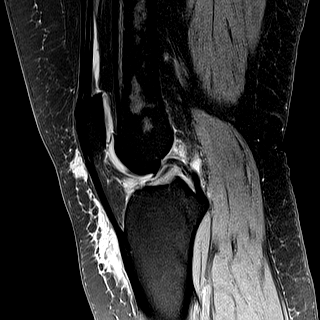
[im 18/27]
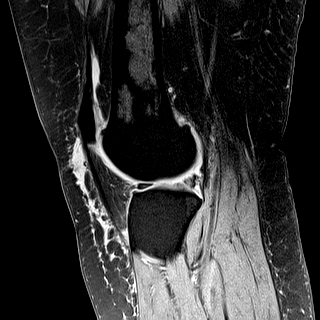
[im 22/27]
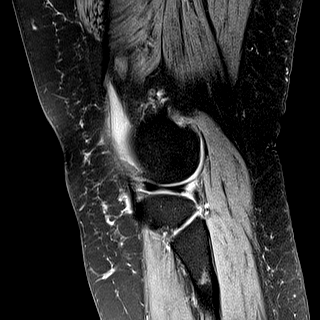
[im 27/27]
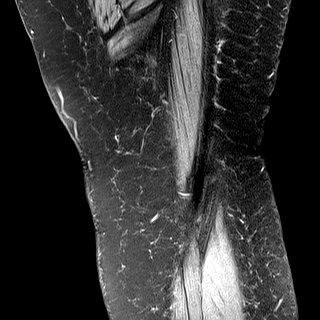

[Series 8: PD · coronal · right · 1.5mm · 0.44mm/px · 5 of 21 slices shown]
[im 1/21]
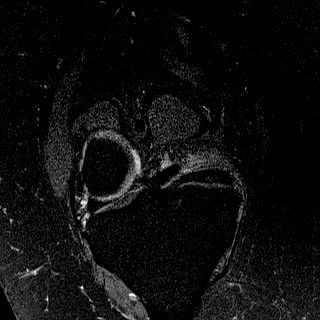
[im 6/21]
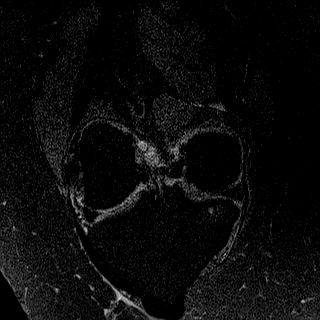
[im 11/21]
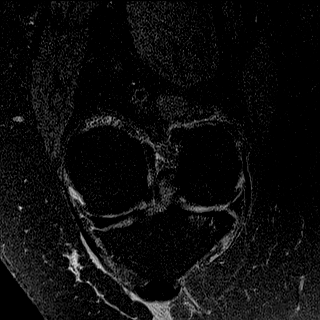
[im 16/21]
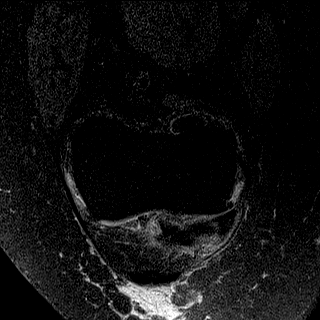
[im 21/21]
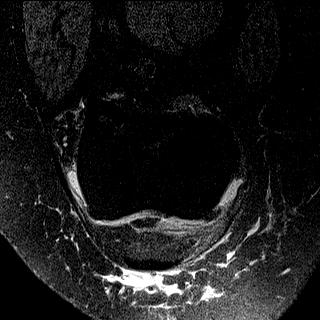

[37 of 40 positions shown; findings below may reference images not displayed]

FINDINGS: MENISCI

Medial meniscus: There is partial degenerative maceration of the
anterior horn. Fraying along the free edge of the body is
identified. Degenerative signal within the substance of the
posterior horn noted.

Lateral meniscus:  Intact.

LIGAMENTS

Cruciates:  Intact.

Collaterals:  Intact.

CARTILAGE

Patellofemoral: Mild thinning and irregularity of the cartilage
surface about the patella is noted. No focal defect.

Medial:  Thinned with some associated joint space narrowing.

Lateral:  Appears preserved.

Joint:  Small effusion.

Popliteal Fossa:  No Baker's cyst.

Extensor Mechanism:  Intact.

Bones: There is some subchondral edema and sclerosis about the
medial compartment. Osteophytosis about the knee is worst medially.
No fracture or worrisome lesion.

Other: None.
IMPRESSION: Dominant finding is advanced for age osteoarthritis in the medial
compartment of the knee. There is associated degenerative maceration
of the anterior horn of the medial meniscus.

Very mild patellofemoral and lateral compartment osteoarthritis.

## 2019-01-26 ENCOUNTER — Ambulatory Visit (INDEPENDENT_AMBULATORY_CARE_PROVIDER_SITE_OTHER): Payer: BC Managed Care – PPO | Admitting: Orthopaedic Surgery

## 2019-01-26 ENCOUNTER — Encounter: Payer: Self-pay | Admitting: Orthopaedic Surgery

## 2019-01-26 DIAGNOSIS — M25561 Pain in right knee: Secondary | ICD-10-CM | POA: Diagnosis not present

## 2019-01-26 DIAGNOSIS — M25562 Pain in left knee: Secondary | ICD-10-CM

## 2019-01-26 DIAGNOSIS — M1711 Unilateral primary osteoarthritis, right knee: Secondary | ICD-10-CM

## 2019-01-26 DIAGNOSIS — G8929 Other chronic pain: Secondary | ICD-10-CM

## 2019-01-26 MED ORDER — LIDOCAINE HCL 1 % IJ SOLN
3.0000 mL | INTRAMUSCULAR | Status: AC | PRN
  Administered 2019-01-26: 3 mL

## 2019-01-26 MED ORDER — METHYLPREDNISOLONE ACETATE 40 MG/ML IJ SUSP
40.0000 mg | INTRAMUSCULAR | Status: AC | PRN
  Administered 2019-01-26: 40 mg via INTRA_ARTICULAR

## 2019-01-26 NOTE — Progress Notes (Signed)
Office Visit Note   Patient: Alison Mccarthy           Date of Birth: December 09, 1970           MRN: [id number] Visit Date: 01/26/2019              Requested by: [facility] [place],  [place] [address] PCP: [facility]   Assessment & Plan: Visit Diagnoses:  1. Unilateral primary osteoarthritis, right knee   2. Chronic pain of right knee   3. Chronic pain of left knee     Plan: I did successfully place steroid injections in both knees today.  All question concerns were answered addressed.  She will continue work on quad strengthening exercises as well as weight loss and activity modification.  All question concerns were answered and addressed.  Follow-up is as needed.  Follow-Up Instructions: Return if symptoms worsen or fail to improve.   Orders:  Orders Placed This Encounter  Procedures  . Large Joint Inj  . Large Joint Inj   No orders of the defined types were placed in this encounter.     Procedures: Large Joint Inj: R knee on 01/26/2019 9:14 AM Indications: diagnostic evaluation and pain Details: 22 G 1.5 in needle, superolateral approach  Arthrogram: No  Medications: 3 mL lidocaine 1 %; 40 mg methylPREDNISolone acetate 40 MG/ML Outcome: tolerated well, no immediate complications Procedure, treatment alternatives, risks and benefits explained, specific risks discussed. Consent was given by the patient. Immediately prior to procedure a time out was called to verify the correct patient, procedure, equipment, support staff and site/side marked as required. Patient was prepped and draped in the usual sterile fashion.   Large Joint Inj: L knee on 01/26/2019 9:14 AM Indications: diagnostic evaluation and pain Details: 22 G 1.5 in needle, superolateral approach  Arthrogram: No  Medications: 3 mL lidocaine 1 %; 40 mg methylPREDNISolone acetate 40 MG/ML Outcome: tolerated well, no immediate complications Procedure, treatment  alternatives, risks and benefits explained, specific risks discussed. Consent was given by the patient. Immediately prior to procedure a time out was called to verify the correct patient, procedure, equipment, support staff and site/side marked as required. Patient was prepped and draped in the usual sterile fashion.       Clinical Data: No additional findings.   Subjective: Chief Complaint  Patient presents with  . Left Knee - Follow-up  . Right Knee - Follow-up  The patient is well-known to [place].  She has known osteoarthritis in both her knees.  She does come in from time to time for steroid injections in her knees.  She is not interested in knee replacement as of yet.  She is only 48 years old and is trying to lose weight and stay active.  Steroid injections do help her when we provide him.  She understands fully the risk and benefits of these injections having had them before.  She has no acute change in her medical status recently.  HPI  Review of Systems She currently denies any headache, chest pain, shortness of breath, fever, chills, nausea, vomiting  Objective: Vital Signs: There were no vitals taken for this visit.  Physical Exam She is alert and oriented x3 and in no acute distress Ortho Exam Examination of both knees show no effusion.  Both knees have good range of motion.  Both knees slightly hyperextend and are globally painful. Specialty Comments:  No specialty comments available.  Imaging: No results found.  PMFS History: Patient Active Problem List   Diagnosis Date Noted  . Chronic pain of left knee 05/19/2017  . Unilateral primary osteoarthritis, left knee 05/19/2017  . Unilateral primary osteoarthritis, right knee 05/19/2017  . Chronic pain of right knee 04/14/2017  . Menorrhagia 01/27/2017   Past Medical History:  Diagnosis Date  . Anemia    history of   . Arthritis   . Depression   . Hypertension   . PONV (postoperative nausea and vomiting)      History reviewed. No pertinent family history.  Past Surgical History:  Procedure Laterality Date  . BREAST REDUCTION SURGERY    . ROBOTIC ASSISTED TOTAL HYSTERECTOMY WITH SALPINGECTOMY Bilateral 01/27/2017   Procedure: ROBOTIC ASSISTED TOTAL HYSTERECTOMY WITH SALPINGECTOMY;  Surgeon: Brien Few, MD;  Location: [facility];  Service: Gynecology;  Laterality: Bilateral;   Social History   Occupational History  . Not on file  Tobacco Use  . Smoking status: Never Smoker  . Smokeless tobacco: Never Used  Substance and Sexual Activity  . Alcohol use: No  . Drug use: No  . Sexual activity: Not on file

## 2019-04-29 ENCOUNTER — Encounter: Payer: Self-pay | Admitting: Orthopaedic Surgery

## 2019-04-29 ENCOUNTER — Ambulatory Visit: Payer: BC Managed Care – PPO | Admitting: Orthopaedic Surgery

## 2019-04-29 ENCOUNTER — Other Ambulatory Visit: Payer: Self-pay

## 2019-04-29 DIAGNOSIS — M1712 Unilateral primary osteoarthritis, left knee: Secondary | ICD-10-CM | POA: Diagnosis not present

## 2019-04-29 DIAGNOSIS — M1711 Unilateral primary osteoarthritis, right knee: Secondary | ICD-10-CM | POA: Diagnosis not present

## 2019-04-29 MED ORDER — METHYLPREDNISOLONE ACETATE 40 MG/ML IJ SUSP
40.0000 mg | INTRAMUSCULAR | Status: AC | PRN
Start: 2019-04-29 — End: 2019-04-29
  Administered 2019-04-29: 40 mg via INTRA_ARTICULAR

## 2019-04-29 MED ORDER — METHYLPREDNISOLONE ACETATE 40 MG/ML IJ SUSP
40.0000 mg | INTRAMUSCULAR | Status: AC | PRN
  Administered 2019-04-29: 40 mg via INTRA_ARTICULAR

## 2019-04-29 MED ORDER — LIDOCAINE HCL 1 % IJ SOLN
0.5000 mL | INTRAMUSCULAR | Status: AC | PRN
  Administered 2019-04-29: .5 mL

## 2019-04-29 MED ORDER — LIDOCAINE HCL 1 % IJ SOLN
0.5000 mL | INTRAMUSCULAR | Status: AC | PRN
Start: 2019-04-29 — End: 2019-04-29
  Administered 2019-04-29: 17:00:00 .5 mL

## 2019-04-29 NOTE — Progress Notes (Signed)
   Procedure Note  Patient: Winefred Dieleman             Date of Birth: 11-11-1970           MRN: [id number]             Visit Date: 04/29/2019  HPI: Mrs. Starr Sinclair is well-known.by my service she has known osteoarthritis both knees.  She comes in today requesting steroid injections both knees.  She had no new injury to either knee.  Physical exam: Bilateral knees: Overall good range of motion no abnormal warmth erythema or effusion  Procedures: Visit Diagnoses:  1. Unilateral primary osteoarthritis, right knee   2. Unilateral primary osteoarthritis, left knee     Large Joint Inj: bilateral knee on 04/29/2019 5:05 PM Indications: pain Details: 25 G 1.5 in needle, anterolateral approach  Arthrogram: No  Medications (Right): 0.5 mL lidocaine 1 %; 40 mg methylPREDNISolone acetate 40 MG/ML Medications (Left): 0.5 mL lidocaine 1 %; 40 mg methylPREDNISolone acetate 40 MG/ML Outcome: tolerated well, no immediate complications Procedure, treatment alternatives, risks and benefits explained, specific risks discussed. Consent was given by the patient. Immediately prior to procedure a time out was called to verify the correct patient, procedure, equipment, support staff and site/side marked as required. Patient was prepped and draped in the usual sterile fashion.     Plan: We will see back in 3 months for repeat injections both knees.  Questions were encouraged and answered

## 2019-06-03 ENCOUNTER — Other Ambulatory Visit: Payer: Self-pay | Admitting: Physician Assistant

## 2019-06-03 NOTE — Telephone Encounter (Signed)
Please advise 

## 2019-06-03 NOTE — Telephone Encounter (Signed)
Patient notified

## 2019-07-06 ENCOUNTER — Other Ambulatory Visit: Payer: Self-pay | Admitting: Physician Assistant

## 2019-07-06 NOTE — Telephone Encounter (Signed)
Ok to refill 

## 2019-07-28 ENCOUNTER — Other Ambulatory Visit: Payer: Self-pay

## 2019-07-28 ENCOUNTER — Encounter: Payer: Self-pay | Admitting: Physician Assistant

## 2019-07-28 ENCOUNTER — Ambulatory Visit: Payer: BC Managed Care – PPO | Admitting: Physician Assistant

## 2019-07-28 DIAGNOSIS — M1711 Unilateral primary osteoarthritis, right knee: Secondary | ICD-10-CM | POA: Diagnosis not present

## 2019-07-28 DIAGNOSIS — M1712 Unilateral primary osteoarthritis, left knee: Secondary | ICD-10-CM

## 2019-07-28 MED ORDER — DICLOFENAC SODIUM 1 % EX GEL: 4.0000 g | Freq: Four times a day (QID) | CUTANEOUS | 1 refills | Status: DC

## 2019-07-28 MED ORDER — LIDOCAINE HCL 1 % IJ SOLN
0.5000 mL | INTRAMUSCULAR | Status: AC | PRN
  Administered 2019-07-28: 17:00:00 .5 mL

## 2019-07-28 MED ORDER — METHYLPREDNISOLONE ACETATE 40 MG/ML IJ SUSP
40.0000 mg | INTRAMUSCULAR | Status: AC | PRN
  Administered 2019-07-28: 40 mg via INTRA_ARTICULAR

## 2019-07-28 MED ORDER — CELECOXIB 200 MG PO CAPS: 200.0000 mg | ORAL_CAPSULE | Freq: Two times a day (BID) | ORAL | 6 refills | Status: DC

## 2019-07-28 NOTE — Progress Notes (Signed)
   Procedure Note  Patient: Alison Mccarthy             Date of Birth: 11-02-1970           MRN: [id number]             Visit Date: 07/28/2019 HPI: [name] comes in today requesting injections both knees.  She states the injections back on 04/29/2019 in both knees very helpful.  Most pain is medial aspect of the knee.  No new injury.  Pain does awaken her at night.  She is also asking for refill on Voltaren gel and Celebrex.  She is taking Tylenol extra strength and turmeric for her knee pain also.  Review of systems: Negative for fevers chills Physical exam: Bilateral knees good range of motion.  No abnormal warmth erythema  Procedures: Visit Diagnoses:  1. Unilateral primary osteoarthritis, right knee   2. Unilateral primary osteoarthritis, left knee     Large Joint Inj: bilateral knee on 07/28/2019 4:36 PM Indications: pain Details: 22 G 1.5 in needle, anterolateral approach  Arthrogram: No  Medications (Right): 0.5 mL lidocaine 1 %; 40 mg methylPREDNISolone acetate 40 MG/ML Medications (Left): 0.5 mL lidocaine 1 %; 40 mg methylPREDNISolone acetate 40 MG/ML Outcome: tolerated well, no immediate complications Procedure, treatment alternatives, risks and benefits explained, specific risks discussed. Consent was given by the patient. Immediately prior to procedure a time out was called to verify the correct patient, procedure, equipment, support staff and site/side marked as required. Patient was prepped and draped in the usual sterile fashion.    Plan: She will follow up with [place] in 3 months for repeat injections both knees.  Questions encouraged and answered at length.

## 2019-10-28 ENCOUNTER — Ambulatory Visit: Payer: BC Managed Care – PPO | Admitting: Physician Assistant

## 2019-10-28 ENCOUNTER — Other Ambulatory Visit: Payer: Self-pay

## 2019-10-28 ENCOUNTER — Encounter: Payer: Self-pay | Admitting: Physician Assistant

## 2019-10-28 DIAGNOSIS — M1711 Unilateral primary osteoarthritis, right knee: Secondary | ICD-10-CM

## 2019-10-28 DIAGNOSIS — M1712 Unilateral primary osteoarthritis, left knee: Secondary | ICD-10-CM

## 2019-10-28 MED ORDER — LIDOCAINE HCL 1 % IJ SOLN
0.5000 mL | INTRAMUSCULAR | Status: AC | PRN
  Administered 2019-10-28: .5 mL

## 2019-10-28 MED ORDER — METHYLPREDNISOLONE ACETATE 40 MG/ML IJ SUSP
40.0000 mg | INTRAMUSCULAR | Status: AC | PRN
  Administered 2019-10-28: 40 mg via INTRA_ARTICULAR

## 2019-10-28 NOTE — Progress Notes (Signed)
   Procedure Note  Patient: Alison Mccarthy             Date of Birth: 12-30-1970           MRN: [id number]             Visit Date: 10/28/2019 HPI: [name] comes in today for bilateral knee pain.  She has known osteoarthritis both knees.  She has had no new injuries to either knee.  States her knees are overall doing well the injections back in March did not help.  She is using Voltaren gel on her knees taking Celebrex Tylenol and turmeric.Marland Kitchen  She denies any fevers chills.  She has not had a Covid vaccine injection and does not plan on having the vaccine.  Physical exam: Bilateral knees good range of motion both knees.  No abnormal warmth erythema or effusion.  Left knee tenderness over the patella tendon.  She is able do straight leg raise.  Right knee she has tenderness over the medial joint line.  No instability valgus varus stressing of either knee.   Procedures: Visit Diagnoses:  1. Unilateral primary osteoarthritis, right knee   2. Unilateral primary osteoarthritis, left knee     Large Joint Inj: bilateral knee on 10/28/2019 4:16 PM Indications: pain Details: 22 G 1.5 in needle, anterolateral approach  Arthrogram: No  Medications (Right): 0.5 mL lidocaine 1 %; 40 mg methylPREDNISolone acetate 40 MG/ML Medications (Left): 0.5 mL lidocaine 1 %; 40 mg methylPREDNISolone acetate 40 MG/ML Outcome: tolerated well, no immediate complications Procedure, treatment alternatives, risks and benefits explained, specific risks discussed. Consent was given by the patient. Immediately prior to procedure a time out was called to verify the correct patient, procedure, equipment, support staff and site/side marked as required. Patient was prepped and draped in the usual sterile fashion.     Plan: She knows to wait at least 3 to 4 months between injections.  Questions encouraged and answered at length.  Follow-up as needed.

## 2020-01-31 ENCOUNTER — Ambulatory Visit (INDEPENDENT_AMBULATORY_CARE_PROVIDER_SITE_OTHER): Payer: BC Managed Care – PPO | Admitting: Physician Assistant

## 2020-01-31 ENCOUNTER — Encounter: Payer: Self-pay | Admitting: Physician Assistant

## 2020-01-31 DIAGNOSIS — M1711 Unilateral primary osteoarthritis, right knee: Secondary | ICD-10-CM

## 2020-01-31 DIAGNOSIS — M1712 Unilateral primary osteoarthritis, left knee: Secondary | ICD-10-CM

## 2020-01-31 MED ORDER — LIDOCAINE HCL 1 % IJ SOLN
0.5000 mL | INTRAMUSCULAR | Status: AC | PRN
Start: 2020-01-31 — End: 2020-01-31
  Administered 2020-01-31: .5 mL

## 2020-01-31 MED ORDER — LIDOCAINE HCL 1 % IJ SOLN
0.5000 mL | INTRAMUSCULAR | Status: AC | PRN
  Administered 2020-01-31: .5 mL

## 2020-01-31 MED ORDER — METHYLPREDNISOLONE ACETATE 40 MG/ML IJ SUSP
40.0000 mg | INTRAMUSCULAR | Status: AC | PRN
  Administered 2020-01-31: 40 mg via INTRA_ARTICULAR

## 2020-01-31 NOTE — Progress Notes (Signed)
   Procedure Note  Patient: Alison Mccarthy             Date of Birth: 1971-04-15           MRN: [id number]             Visit Date: 01/31/2020 HPI  : [name] is well-known to Dr. Ninfa Linden service with known osteoarthritis both knees.  She comes in today requesting injections both knees.  States the last injections on 1621 were very helpful.  She has had no new injury to either knee.  She takes occasional NSAIDs for the pain in her knees.  Physical exam: Bilateral knees good range of motion.  Significant patellofemoral crepitus both knees.  Tenderness along medial joint line of the right knee.  No abnormal warmth erythema or effusion of either knee.   Procedures: Visit Diagnoses:  1. Unilateral primary osteoarthritis, right knee   2. Unilateral primary osteoarthritis, left knee     Large Joint Inj: bilateral knee on 01/31/2020 10:07 AM Indications: pain Details: 22 G 1.5 in needle, anterolateral approach  Arthrogram: No  Medications (Right): 0.5 mL lidocaine 1 %; 40 mg methylPREDNISolone acetate 40 MG/ML Medications (Left): 0.5 mL lidocaine 1 %; 40 mg methylPREDNISolone acetate 40 MG/ML Outcome: tolerated well, no immediate complications Procedure, treatment alternatives, risks and benefits explained, specific risks discussed. Consent was given by the patient. Immediately prior to procedure a time out was called to verify the correct patient, procedure, equipment, support staff and site/side marked as required. Patient was prepped and draped in the usual sterile fashion.    Plan: Per patient's request we will see her back in 3 months for repeat injections both knees.  Questions were encouraged and answered.  She is to continue to work on quad strengthening both knees.

## 2020-04-20 ENCOUNTER — Ambulatory Visit: Payer: BC Managed Care – PPO | Admitting: Physician Assistant

## 2020-04-20 ENCOUNTER — Encounter: Payer: Self-pay | Admitting: Physician Assistant

## 2020-04-20 DIAGNOSIS — M1711 Unilateral primary osteoarthritis, right knee: Secondary | ICD-10-CM | POA: Diagnosis not present

## 2020-04-20 DIAGNOSIS — M1712 Unilateral primary osteoarthritis, left knee: Secondary | ICD-10-CM

## 2020-04-20 NOTE — Progress Notes (Signed)
   Procedure Note  Patient: Alison Mccarthy             Date of Birth: 09/25/1970           MRN: [id number]             Visit Date: 04/20/2020 HPI: Alison Mccarthy comes in today requesting cortisone injections both knees.  Last injections were in September and she states these lasted about 10 weeks.  She has had no new injury to either knee.  She has known osteoarthritis both knees.  Review of systems: Denies any fevers chills or recent vaccines.  Physical exam: Bilateral knees good range of motion no abnormal warmth erythema or effusion. Procedures: Visit Diagnoses:  1. Unilateral primary osteoarthritis, right knee   2. Unilateral primary osteoarthritis, left knee     Large Joint Inj: bilateral knee on 04/20/2020 4:51 PM Indications: pain Details: 22 G 1.5 in needle, anterolateral approach  Arthrogram: No  Outcome: tolerated well, no immediate complications Procedure, treatment alternatives, risks and benefits explained, specific risks discussed. Consent was given by the patient. Immediately prior to procedure a time out was called to verify the correct patient, procedure, equipment, support staff and site/side marked as required. Patient was prepped and draped in the usual sterile fashion.     Plan: We will see her back proximally 3 months for repeat injections.  Questions were encouraged and answered.

## 2020-07-19 ENCOUNTER — Encounter: Payer: Self-pay | Admitting: Physician Assistant

## 2020-07-19 ENCOUNTER — Ambulatory Visit: Payer: BC Managed Care – PPO | Admitting: Physician Assistant

## 2020-07-19 DIAGNOSIS — M1711 Unilateral primary osteoarthritis, right knee: Secondary | ICD-10-CM

## 2020-07-19 DIAGNOSIS — M1712 Unilateral primary osteoarthritis, left knee: Secondary | ICD-10-CM | POA: Diagnosis not present

## 2020-07-19 MED ORDER — LIDOCAINE HCL 1 % IJ SOLN
0.5000 mL | INTRAMUSCULAR | Status: AC | PRN
  Administered 2020-07-19: .5 mL

## 2020-07-19 MED ORDER — METHYLPREDNISOLONE ACETATE 40 MG/ML IJ SUSP
40.0000 mg | INTRAMUSCULAR | Status: AC | PRN
  Administered 2020-07-19: 40 mg via INTRA_ARTICULAR

## 2020-07-19 NOTE — Progress Notes (Signed)
   Procedure Note  Patient: Amani Marseille             Date of Birth: 1971/02/21           MRN: [id number]             Visit Date: 07/19/2020 HPI: Ms. Baez comes in today for scheduled repeat cortisone injection of both knees.  She has known osteoarthritis both knees.  She denies any injury to either knee.  Right knee is most bothersome along the medial joint line.  She denies any change in her health status.  No recent fevers or chills.  Physical exam: Bilateral knees good range of motion no abnormal warmth erythema or effusion.  Procedures: Visit Diagnoses:  1. Unilateral primary osteoarthritis, right knee   2. Unilateral primary osteoarthritis, left knee     Large Joint Inj: bilateral knee on 07/19/2020 2:27 PM Indications: pain Details: 22 G 1.5 in needle, anterolateral approach  Arthrogram: No  Medications (Right): 0.5 mL lidocaine 1 %; 40 mg methylPREDNISolone acetate 40 MG/ML Medications (Left): 0.5 mL lidocaine 1 %; 40 mg methylPREDNISolone acetate 40 MG/ML Outcome: tolerated well, no immediate complications Procedure, treatment alternatives, risks and benefits explained, specific risks discussed. Consent was given by the patient. Immediately prior to procedure a time out was called to verify the correct patient, procedure, equipment, support staff and site/side marked as required. Patient was prepped and draped in the usual sterile fashion.      Plan: She will follow up with [place] in 3 months for repeat injections both knees.  Questions were encouraged and answered.

## 2020-10-15 ENCOUNTER — Other Ambulatory Visit: Payer: Self-pay | Admitting: Physician Assistant

## 2020-10-18 ENCOUNTER — Other Ambulatory Visit: Payer: Self-pay | Admitting: Physician Assistant

## 2020-12-04 ENCOUNTER — Other Ambulatory Visit: Payer: Self-pay

## 2020-12-04 ENCOUNTER — Encounter: Payer: Self-pay | Admitting: Physician Assistant

## 2020-12-04 ENCOUNTER — Ambulatory Visit: Payer: BC Managed Care – PPO | Admitting: Physician Assistant

## 2020-12-04 DIAGNOSIS — M1711 Unilateral primary osteoarthritis, right knee: Secondary | ICD-10-CM

## 2020-12-04 DIAGNOSIS — M1712 Unilateral primary osteoarthritis, left knee: Secondary | ICD-10-CM | POA: Diagnosis not present

## 2020-12-04 MED ORDER — METHYLPREDNISOLONE ACETATE 40 MG/ML IJ SUSP
40.0000 mg | INTRAMUSCULAR | Status: AC | PRN
  Administered 2020-12-04: 40 mg via INTRA_ARTICULAR

## 2020-12-04 MED ORDER — LIDOCAINE HCL 1 % IJ SOLN
3.0000 mL | INTRAMUSCULAR | Status: AC | PRN
  Administered 2020-12-04: 3 mL

## 2020-12-04 MED ORDER — LIDOCAINE HCL 1 % IJ SOLN
3.0000 mL | INTRAMUSCULAR | Status: AC | PRN
Start: 2020-12-04 — End: 2020-12-04
  Administered 2020-12-04: 3 mL

## 2020-12-04 NOTE — Progress Notes (Signed)
   Procedure Note  Patient: Alison Mccarthy             Date of Birth: [birth year]           MRN: [id number]             Visit Date: 12/04/2020 HPI: Ms. Alison Mccarthy comes in today requesting bilateral knee injections with cortisone.  She has had no new injury to either knee.  She has known osteoarthritis both knees.  She states that the injections did help.  She is also using Celebrex and Voltaren gel which she states helps.  Review of systems: Denies any fevers, chills, recent vaccines or infections.  Physical exam: Bilateral knees excellent range of motion both knees.  Patellofemoral crepitus left greater than right with passive range of motion both knees.  No abnormal warmth erythema or effusion of either knee.  Procedures: Visit Diagnoses:  1. Unilateral primary osteoarthritis, left knee   2. Unilateral primary osteoarthritis, right knee     Large Joint Inj: bilateral knee on 12/04/2020 11:50 AM Indications: pain Details: 22 G 1.5 in needle, anterolateral approach  Arthrogram: No  Medications (Right): 3 mL lidocaine 1 %; 40 mg methylPREDNISolone acetate 40 MG/ML Medications (Left): 3 mL lidocaine 1 %; 40 mg methylPREDNISolone acetate 40 MG/ML Outcome: tolerated well, no immediate complications Procedure, treatment alternatives, risks and benefits explained, specific risks discussed. Consent was given by the patient. Immediately prior to procedure a time out was called to verify the correct patient, procedure, equipment, support staff and site/side marked as required. Patient was prepped and draped in the usual sterile fashion.    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  She will follow-up as needed.  She has to wait at least 3 months between cortisone injections.  Questions were encouraged and answered at length.

## 2021-01-16 ENCOUNTER — Other Ambulatory Visit: Payer: Self-pay

## 2021-01-17 MED ORDER — CELECOXIB 200 MG PO CAPS: 200.0000 mg | ORAL_CAPSULE | Freq: Two times a day (BID) | ORAL | 0 refills | Status: DC

## 2021-01-27 ENCOUNTER — Other Ambulatory Visit: Payer: Self-pay | Admitting: Physician Assistant

## 2021-01-29 NOTE — Telephone Encounter (Signed)
ok 

## 2021-03-07 ENCOUNTER — Encounter: Payer: Self-pay | Admitting: Physician Assistant

## 2021-03-07 ENCOUNTER — Telehealth: Payer: Self-pay

## 2021-03-07 ENCOUNTER — Other Ambulatory Visit: Payer: Self-pay

## 2021-03-07 ENCOUNTER — Ambulatory Visit: Payer: BC Managed Care – PPO | Admitting: Physician Assistant

## 2021-03-07 DIAGNOSIS — M1712 Unilateral primary osteoarthritis, left knee: Secondary | ICD-10-CM

## 2021-03-07 DIAGNOSIS — M170 Bilateral primary osteoarthritis of knee: Secondary | ICD-10-CM

## 2021-03-07 DIAGNOSIS — M1711 Unilateral primary osteoarthritis, right knee: Secondary | ICD-10-CM

## 2021-03-07 MED ORDER — LIDOCAINE HCL 1 % IJ SOLN
3.0000 mL | INTRAMUSCULAR | Status: AC | PRN
  Administered 2021-03-07: 3 mL

## 2021-03-07 MED ORDER — METHYLPREDNISOLONE ACETATE 40 MG/ML IJ SUSP
40.0000 mg | INTRAMUSCULAR | Status: AC | PRN
  Administered 2021-03-07: 40 mg via INTRA_ARTICULAR

## 2021-03-07 NOTE — Progress Notes (Signed)
   Procedure Note  Patient: Alison Mccarthy             Date of Birth: 1970/06/13           MRN: [id number]             Visit Date: 03/07/2021  HPI: Alison Mccarthy comes in today for bilateral knee injections.  She last had injections on 12/04/2020 with cortisone and states that these really did not help much.  She is asking about possible Euflexxa injections.  In the past she has had Synvisc 1 injections which really did not help.  However she does not want to undergo a knee replacement at this point time.  She is wanting to try something different than the cortisone as it is decreasing in efficacy.  Physical exam: Bilateral knees no abnormal warmth erythema or effusion.  Good range of motion both knees.  Procedures: Visit Diagnoses:  1. Unilateral primary osteoarthritis, left knee   2. Unilateral primary osteoarthritis, right knee     Large Joint Inj: bilateral knee on 03/07/2021 4:11 PM Indications: pain Details: 22 G 1.5 in needle, anterolateral approach  Arthrogram: No  Medications (Right): 3 mL lidocaine 1 %; 40 mg methylPREDNISolone acetate 40 MG/ML Medications (Left): 3 mL lidocaine 1 %; 40 mg methylPREDNISolone acetate 40 MG/ML Outcome: tolerated well, no immediate complications Procedure, treatment alternatives, risks and benefits explained, specific risks discussed. Consent was given by the patient. Immediately prior to procedure a time out was called to verify the correct patient, procedure, equipment, support staff and site/side marked as required. Patient was prepped and draped in the usual sterile fashion.    Plan: We will try to gain approval for Euflexxa injections both knees and have her back once these are available.  Questions encouraged and answered.  She will continue to work on weight loss she is lost approximately 25 pounds.  Also continue to work on Forensic scientist.

## 2021-03-07 NOTE — Telephone Encounter (Signed)
Noted  

## 2021-03-07 NOTE — Telephone Encounter (Signed)
Please get auth for bilateral knee gel injections-Gil pt   Pt prefers to do [employer] doesn't want to repeat synvisc one again

## 2021-03-14 ENCOUNTER — Other Ambulatory Visit: Payer: Self-pay | Admitting: Orthopaedic Surgery

## 2021-03-31 ENCOUNTER — Other Ambulatory Visit: Payer: Self-pay | Admitting: Orthopaedic Surgery

## 2021-04-02 ENCOUNTER — Telehealth: Payer: Self-pay

## 2021-04-02 NOTE — Telephone Encounter (Signed)
VOB submitted for Euflexxa, bilateral knee. BV Pending

## 2021-04-06 ENCOUNTER — Telehealth: Payer: Self-pay | Admitting: Orthopaedic Surgery

## 2021-04-06 ENCOUNTER — Telehealth: Payer: Self-pay

## 2021-04-06 NOTE — Telephone Encounter (Signed)
Patient will call insurance to verify coverage for euflexxa, bilateral knee. Patient was given codes to provide to her insurance.

## 2021-04-06 NOTE — Telephone Encounter (Signed)
Patient called. Would like to speak with April about her gel injection. Her call back number is [phone]

## 2021-04-09 NOTE — Telephone Encounter (Signed)
Talked with patient concerning her insurance for gel injection.

## 2021-04-11 ENCOUNTER — Telehealth: Payer: Self-pay

## 2021-04-11 NOTE — Telephone Encounter (Signed)
Approved for Euflexxa, bilateral knee. [place] Patient will be responsible for 20% OOP. Could have a Co-pay of $40.00 No PA required per [place] with [employer] of [place].

## 2021-04-16 ENCOUNTER — Ambulatory Visit: Payer: BC Managed Care – PPO | Admitting: Physician Assistant

## 2021-04-16 ENCOUNTER — Other Ambulatory Visit: Payer: Self-pay

## 2021-04-16 ENCOUNTER — Encounter: Payer: Self-pay | Admitting: Physician Assistant

## 2021-04-16 DIAGNOSIS — M170 Bilateral primary osteoarthritis of knee: Secondary | ICD-10-CM | POA: Diagnosis not present

## 2021-04-16 DIAGNOSIS — M1711 Unilateral primary osteoarthritis, right knee: Secondary | ICD-10-CM | POA: Diagnosis not present

## 2021-04-16 DIAGNOSIS — M1712 Unilateral primary osteoarthritis, left knee: Secondary | ICD-10-CM | POA: Diagnosis not present

## 2021-04-16 MED ORDER — SODIUM HYALURONATE (VISCOSUP) 20 MG/2ML IX SOSY
20.0000 mg | PREFILLED_SYRINGE | INTRA_ARTICULAR | Status: AC | PRN
Start: 2021-04-16 — End: 2021-04-16
  Administered 2021-04-16: 17:00:00 20 mg via INTRA_ARTICULAR

## 2021-04-16 NOTE — Progress Notes (Signed)
   Procedure Note  Patient: Alison Mccarthy             Date of Birth: [birth year]           MRN: [id number]             Visit Date: 04/16/2021 HPI: [name] comes in today for scheduled Euflexxa injections both knees.  She has known osteoarthritis of both knees she is failed conservative measures which have included exercise, activity modification and cortisone injections.  She has no scheduled knee surgery in the next 6 months.  Bilateral knees: Good range of motion both knees no abnormal warmth erythema or effusion Procedures: Visit Diagnoses:  1. Unilateral primary osteoarthritis, left knee   2. Unilateral primary osteoarthritis, right knee     Large Joint Inj: bilateral knee on 04/16/2021 4:55 PM Indications: pain Details: 22 G 1.5 in needle, anterolateral approach  Arthrogram: No  Medications (Right): 20 mg Sodium Hyaluronate 20 MG/2ML Medications (Left): 20 mg Sodium Hyaluronate 20 MG/2ML Outcome: tolerated well, no immediate complications Procedure, treatment alternatives, risks and benefits explained, specific risks discussed. Consent was given by the patient. Immediately prior to procedure a time out was called to verify the correct patient, procedure, equipment, support staff and site/side marked as required. Patient was prepped and draped in the usual sterile fashion.    Plan: We will see her back in 1 week for her second of 3 injections both knees.  She tolerated the injections well today.

## 2021-04-23 ENCOUNTER — Other Ambulatory Visit: Payer: Self-pay

## 2021-04-23 ENCOUNTER — Ambulatory Visit: Payer: BC Managed Care – PPO | Admitting: Physician Assistant

## 2021-04-23 ENCOUNTER — Encounter: Payer: Self-pay | Admitting: Physician Assistant

## 2021-04-23 DIAGNOSIS — M170 Bilateral primary osteoarthritis of knee: Secondary | ICD-10-CM | POA: Diagnosis not present

## 2021-04-23 DIAGNOSIS — M1711 Unilateral primary osteoarthritis, right knee: Secondary | ICD-10-CM | POA: Diagnosis not present

## 2021-04-23 DIAGNOSIS — M1712 Unilateral primary osteoarthritis, left knee: Secondary | ICD-10-CM

## 2021-04-23 MED ORDER — SODIUM HYALURONATE (VISCOSUP) 20 MG/2ML IX SOSY
20.0000 mg | PREFILLED_SYRINGE | INTRA_ARTICULAR | Status: AC | PRN
  Administered 2021-04-23: 20 mg via INTRA_ARTICULAR

## 2021-04-23 MED ORDER — SODIUM HYALURONATE (VISCOSUP) 20 MG/2ML IX SOSY
20.0000 mg | PREFILLED_SYRINGE | INTRA_ARTICULAR | Status: AC | PRN
Start: 2021-04-23 — End: 2021-04-23
  Administered 2021-04-23: 20 mg via INTRA_ARTICULAR

## 2021-04-23 NOTE — Progress Notes (Signed)
  Bilateral knees:  Procedure Note  Patient: Alison Mccarthy             Date of Birth: May 23, 1970           MRN: [id number]             Visit Date: 04/23/2021  HPI: Ms. Alison Mccarthy returns today for her bilateral Euflexxa injections.  This is the second of 3 injections both knees with Euflexxa.  She tolerated last injections well.  She has known osteoarthritis of both knees.  Physical exam: Bilateral knees no abnormal warmth erythema or effusion. Procedures: Visit Diagnoses:  1. Unilateral primary osteoarthritis, left knee   2. Unilateral primary osteoarthritis, right knee     Large Joint Inj: bilateral knee on 04/23/2021 5:27 PM Indications: pain Details: 22 G 1.5 in needle, anterolateral approach  Arthrogram: No  Medications (Right): 20 mg Sodium Hyaluronate 20 MG/2ML Medications (Left): 20 mg Sodium Hyaluronate 20 MG/2ML Outcome: tolerated well, no immediate complications Procedure, treatment alternatives, risks and benefits explained, specific risks discussed. Consent was given by the patient. Immediately prior to procedure a time out was called to verify the correct patient, procedure, equipment, support staff and site/side marked as required. Patient was prepped and draped in the usual sterile fashion.    Plan: She will follow-up with [place] in 1 week for her third Euflexxa injections both knees.  Questions were encouraged and answered.

## 2021-04-27 ENCOUNTER — Other Ambulatory Visit: Payer: Self-pay | Admitting: Orthopaedic Surgery

## 2021-04-30 ENCOUNTER — Ambulatory Visit: Payer: BC Managed Care – PPO | Admitting: Physician Assistant

## 2021-04-30 ENCOUNTER — Encounter: Payer: Self-pay | Admitting: Physician Assistant

## 2021-04-30 ENCOUNTER — Other Ambulatory Visit: Payer: Self-pay

## 2021-04-30 DIAGNOSIS — M170 Bilateral primary osteoarthritis of knee: Secondary | ICD-10-CM

## 2021-04-30 DIAGNOSIS — M1712 Unilateral primary osteoarthritis, left knee: Secondary | ICD-10-CM | POA: Diagnosis not present

## 2021-04-30 DIAGNOSIS — M1711 Unilateral primary osteoarthritis, right knee: Secondary | ICD-10-CM | POA: Diagnosis not present

## 2021-04-30 MED ORDER — SODIUM HYALURONATE (VISCOSUP) 20 MG/2ML IX SOSY
20.0000 mg | PREFILLED_SYRINGE | INTRA_ARTICULAR | Status: AC | PRN
  Administered 2021-04-30: 20 mg via INTRA_ARTICULAR

## 2021-04-30 MED ORDER — SODIUM HYALURONATE (VISCOSUP) 20 MG/2ML IX SOSY
20.0000 mg | PREFILLED_SYRINGE | INTRA_ARTICULAR | Status: AC | PRN
Start: 2021-04-30 — End: 2021-04-30
  Administered 2021-04-30: 20 mg via INTRA_ARTICULAR

## 2021-04-30 NOTE — Progress Notes (Signed)
   Procedure Note  Patient: Quatisha Zylka             Date of Birth: September 12, 1970           MRN: [id number]             Visit Date: 04/30/2021 [name] comes in today for her third injection bilateral knees with Euflexxa.  She states she is seeing some benefit from the injections.  Again she has known osteoarthritis both knees.  She has had no new injuries.  Physical exam: Bilateral knees: No abnormal warmth erythema.  Good range of motion of both knees.  No effusion of either knee. Procedures: Visit Diagnoses:  1. Unilateral primary osteoarthritis, left knee   2. Unilateral primary osteoarthritis, right knee     Large Joint Inj: bilateral knee on 04/30/2021 4:18 PM Indications: pain Details: 22 G 1.5 in needle, anterolateral approach  Arthrogram: No  Medications (Right): 20 mg Sodium Hyaluronate 20 MG/2ML Medications (Left): 20 mg Sodium Hyaluronate 20 MG/2ML Outcome: tolerated well, no immediate complications Procedure, treatment alternatives, risks and benefits explained, specific risks discussed. Consent was given by the patient. Immediately prior to procedure a time out was called to verify the correct patient, procedure, equipment, support staff and site/side marked as required. Patient was prepped and draped in the usual sterile fashion.     Plan: She will follow-up with [place] as needed.  She knows to wait at least 6 months between injections.  Questions were encouraged and answered.

## 2021-05-27 ENCOUNTER — Other Ambulatory Visit: Payer: Self-pay | Admitting: Orthopaedic Surgery

## 2021-06-26 ENCOUNTER — Other Ambulatory Visit: Payer: Self-pay | Admitting: Orthopaedic Surgery

## 2021-07-26 ENCOUNTER — Other Ambulatory Visit: Payer: Self-pay | Admitting: Orthopaedic Surgery

## 2021-09-18 ENCOUNTER — Other Ambulatory Visit: Payer: Self-pay | Admitting: Orthopaedic Surgery

## 2021-09-19 MED ORDER — CELECOXIB 200 MG PO CAPS: 200.0000 mg | ORAL_CAPSULE | Freq: Two times a day (BID) | ORAL | 0 refills | Status: DC

## 2021-11-14 ENCOUNTER — Other Ambulatory Visit: Payer: Self-pay | Admitting: Orthopaedic Surgery

## 2021-12-14 ENCOUNTER — Other Ambulatory Visit: Payer: Self-pay | Admitting: Orthopaedic Surgery

## 2022-01-14 ENCOUNTER — Other Ambulatory Visit: Payer: Self-pay | Admitting: Orthopaedic Surgery

## 2022-02-11 ENCOUNTER — Other Ambulatory Visit: Payer: Self-pay | Admitting: Orthopaedic Surgery

## 2022-02-25 ENCOUNTER — Other Ambulatory Visit: Payer: Self-pay | Admitting: Orthopaedic Surgery

## 2022-02-25 MED ORDER — CELECOXIB 200 MG PO CAPS: 200.0000 mg | ORAL_CAPSULE | Freq: Two times a day (BID) | ORAL | 0 refills | Status: DC

## 2022-04-22 ENCOUNTER — Ambulatory Visit: Payer: BC Managed Care – PPO | Admitting: Physician Assistant

## 2022-04-22 ENCOUNTER — Encounter: Payer: Self-pay | Admitting: Physician Assistant

## 2022-04-22 ENCOUNTER — Other Ambulatory Visit: Payer: Self-pay | Admitting: Physician Assistant

## 2022-04-22 ENCOUNTER — Ambulatory Visit: Payer: Self-pay

## 2022-04-22 ENCOUNTER — Ambulatory Visit (INDEPENDENT_AMBULATORY_CARE_PROVIDER_SITE_OTHER): Payer: BC Managed Care – PPO

## 2022-04-22 DIAGNOSIS — M170 Bilateral primary osteoarthritis of knee: Secondary | ICD-10-CM | POA: Diagnosis not present

## 2022-04-22 DIAGNOSIS — M1711 Unilateral primary osteoarthritis, right knee: Secondary | ICD-10-CM | POA: Diagnosis not present

## 2022-04-22 DIAGNOSIS — M1712 Unilateral primary osteoarthritis, left knee: Secondary | ICD-10-CM | POA: Diagnosis not present

## 2022-04-22 MED ORDER — LIDOCAINE HCL 1 % IJ SOLN
3.0000 mL | INTRAMUSCULAR | Status: AC | PRN
  Administered 2022-04-22: 3 mL

## 2022-04-22 MED ORDER — TRIAMCINOLONE ACETONIDE 40 MG/ML IJ SUSP
40.0000 mg | INTRAMUSCULAR | Status: AC | PRN
  Administered 2022-04-22: 40 mg via INTRA_ARTICULAR

## 2022-04-22 NOTE — Progress Notes (Signed)
Office Visit Note   Patient: Alison Mccarthy           Date of Birth: [birth year]           MRN: [id number] Visit Date: 04/22/2022              Requested by: [facility] [place],  [place] [id number] PCP: [facility]   Assessment & Plan: Visit Diagnoses:  1. Unilateral primary osteoarthritis, left knee   2. Unilateral primary osteoarthritis, right knee     Plan: We will see how she does with the Kenalog.  She knows to wait at least 3 months between injections.  Questions were encouraged and answered.  Did speak with her about taking ibuprofen and Celebrex.  She states that her primary care provider is aware of her taking both.  I cautioned her and told her to keep a close eye on her lab work particular kidney values.  Follow-Up Instructions: No follow-ups on file.   Orders:  Orders Placed This Encounter  Procedures   Large Joint Inj   XR Knee 1-2 Views Left   XR Knee 1-2 Views Right   No orders of the defined types were placed in this encounter.     Procedures: Large Joint Inj: bilateral knee on 04/22/2022 11:48 AM Indications: pain Details: 22 G 1.5 in needle, anterolateral approach  Arthrogram: No  Medications (Right): 3 mL lidocaine 1 %; 40 mg triamcinolone acetonide 40 MG/ML Medications (Left): 3 mL lidocaine 1 %; 40 mg triamcinolone acetonide 40 MG/ML Outcome: tolerated well, no immediate complications Procedure, treatment alternatives, risks and benefits explained, specific risks discussed. Consent was given by the patient. Immediately prior to procedure a time out was called to verify the correct patient, procedure, equipment, support staff and site/side marked as required. Patient was prepped and draped in the usual sterile fashion.       Clinical Data: No additional findings.   Subjective: Chief Complaint  Patient presents with   Left Knee - Pain   Right Knee - Pain    HPI: Alison Mccarthy is well-known to Dr.  Ninfa Linden service.  Comes in today for Bilateral knee pain right greater than left. Trouble getting in and out of vehicle [place]. Knees lock up.Stiffness when going from sitting position to standing. Effulexa injection she does not feel like it helped. No new injuries. Taking ibuprofen and Celebrex. Using creams on knees.   States she had a Kenalog injection IM and that this she feels helped with her knee pain.  This was given for back pain.  She is asking if we could try Kenalog in her knee today instead of Depo-Medrol.  Review of Systems  Constitutional:  Negative for chills and fever.  Respiratory:  Negative for shortness of breath.   Cardiovascular:  Negative for chest pain.  Musculoskeletal:  Positive for arthralgias.     Objective: Vital Signs: There were no vitals taken for this visit.  Physical Exam Constitutional:      Appearance: She is not ill-appearing or diaphoretic.  Pulmonary:     Effort: Pulmonary effort is normal.  Neurological:     General: No focal deficit present.     Mental Status: She is alert and oriented to person, place, and time.     Ortho Exam Bilateral knees: Good range of motion both knees.  Patellofemoral crepitus bilaterally.  Tenderness along medial joint line of both knees.  No abnormal warmth erythema or effusion of either knee.  No instability valgus varus stressing of either knee. Specialty Comments:  No specialty comments available.  Imaging: XR Knee 1-2 Views Left  Result Date: 04/22/2022 Left knee 2 views: Knee is well located.  No acute fractures or acute findings.  Near bone-on-bone medial compartment.  Severe patellofemoral arthritic changes.  Lateral compartment with mild to moderate changes.  XR Knee 1-2 Views Right  Result Date: 04/22/2022 Right knee 2 views: Tricompartmental arthritis with bone-on-bone medial compartment.  Severe patellofemoral arthritis.  Anvil spurs present on the lateral view.  Lateral compartment moderate arthritic  changes with periarticular spurring.  No acute fracture.    PMFS History: Patient Active Problem List   Diagnosis Date Noted   Chronic pain of left knee 05/19/2017   Unilateral primary osteoarthritis, left knee 05/19/2017   Unilateral primary osteoarthritis, right knee 05/19/2017   Chronic pain of right knee 04/14/2017   Menorrhagia 01/27/2017   Past Medical History:  Diagnosis Date   Anemia    history of    Arthritis    Depression    Hypertension    PONV (postoperative nausea and vomiting)     History reviewed. No pertinent family history.  Past Surgical History:  Procedure Laterality Date   BREAST REDUCTION SURGERY     ROBOTIC ASSISTED TOTAL HYSTERECTOMY WITH SALPINGECTOMY Bilateral 01/27/2017   Procedure: ROBOTIC ASSISTED TOTAL HYSTERECTOMY WITH SALPINGECTOMY;  Surgeon: Brien Few, MD;  Location: [facility];  Service: Gynecology;  Laterality: Bilateral;   Social History   Occupational History   Not on file  Tobacco Use   Smoking status: Never   Smokeless tobacco: Never  Substance and Sexual Activity   Alcohol use: No   Drug use: No   Sexual activity: Not on file

## 2022-05-22 ENCOUNTER — Other Ambulatory Visit: Payer: Self-pay | Admitting: Physician Assistant

## 2022-05-27 ENCOUNTER — Other Ambulatory Visit: Payer: Self-pay | Admitting: Physician Assistant

## 2022-05-27 MED ORDER — CELECOXIB 200 MG PO CAPS: 200.0000 mg | ORAL_CAPSULE | Freq: Two times a day (BID) | ORAL | 12 refills | Status: DC

## 2022-07-25 ENCOUNTER — Encounter: Payer: Self-pay | Admitting: Radiology

## 2023-06-20 ENCOUNTER — Other Ambulatory Visit: Payer: Self-pay | Admitting: Physician Assistant

## 2023-07-21 ENCOUNTER — Other Ambulatory Visit: Payer: Self-pay | Admitting: Physician Assistant

## 2023-07-25 ENCOUNTER — Other Ambulatory Visit: Payer: Self-pay | Admitting: Interventional Radiology

## 2023-07-25 DIAGNOSIS — M1711 Unilateral primary osteoarthritis, right knee: Secondary | ICD-10-CM

## 2023-08-10 NOTE — Progress Notes (Signed)
 Chief Complaint: Patient was seen in consultation today for right knee pain.  Referring Physician(s): Self-Referral   History of Present Illness: Alison Mccarthy is a 53 y.o. female with a medical history significant for anemia, HTN, morbid obesity, depression, arthritis and primary osteoarthritis of both knees. She has been previously treated with bilateral cortisone injections and the Euflexxa series. Conservative measures such as exercise and activity modification have been ineffective.   [place] Pain Score =  VAS Pain Score =   Past Medical History:  Diagnosis Date   Anemia    history of    Arthritis    Depression    Hypertension    PONV (postoperative nausea and vomiting)     Past Surgical History:  Procedure Laterality Date   BREAST REDUCTION SURGERY     ROBOTIC ASSISTED TOTAL HYSTERECTOMY WITH SALPINGECTOMY Bilateral 01/27/2017   Procedure: ROBOTIC ASSISTED TOTAL HYSTERECTOMY WITH SALPINGECTOMY;  Surgeon: Olivia Mackie, MD;  Location: [facility];  Service: Gynecology;  Laterality: Bilateral;    Allergies: Patient has no known allergies.  Medications: Prior to Admission medications   Medication Sig Start Date End Date Taking? Authorizing Provider  acetaminophen (TYLENOL) 650 MG CR tablet Take 1,300 mg by mouth at bedtime as needed for pain.    [provider]  celecoxib (CELEBREX) 200 MG capsule Take 1 capsule by mouth twice daily 09/19/21   Kathryne Hitch, MD  celecoxib (CELEBREX) 200 MG capsule Take 1 capsule by mouth twice daily 07/21/23   Kathryne Hitch, MD     No family history on file.  Social History   Socioeconomic History   Marital status: Divorced    Spouse name: Not on file   Number of children: Not on file   Years of education: Not on file   Highest education level: Not on file  Occupational History   Not on file  Tobacco Use   Smoking status: Never   Smokeless tobacco: Never  Substance and Sexual Activity   Alcohol use:  No   Drug use: No   Sexual activity: Not on file  Other Topics Concern   Not on file  Social History Narrative   Not on file   Social Drivers of Health   Financial Resource Strain: Not on file  Food Insecurity: Not on file  Transportation Needs: Not on file  Physical Activity: Not on file  Stress: Not on file  Social Connections: Not on file    Review of Systems: A 12 point ROS discussed and pertinent positives are indicated in the HPI above.  All other systems are negative.  Review of Systems  Vital Signs: There were no vitals taken for this visit.    Physical Exam  Imaging: No results found.  Labs:  CBC: No results for input(s): "WBC", "HGB", "HCT", "PLT" in the last 8760 hours.  COAGS: No results for input(s): "INR", "APTT" in the last 8760 hours.  BMP: No results for input(s): "NA", "K", "CL", "CO2", "GLUCOSE", "BUN", "CALCIUM", "CREATININE", "GFRNONAA", "GFRAA" in the last 8760 hours.  Invalid input(s): "CMP"  LIVER FUNCTION TESTS: No results for input(s): "BILITOT", "AST", "ALT", "ALKPHOS", "PROT", "ALBUMIN" in the last 8760 hours.  TUMOR MARKERS: No results for input(s): "AFPTM", "CEA", "CA199", "CHROMGRNA" in the last 8760 hours.  Assessment and Plan:  53 year old female with a history of primary osteoarthritis of both knees with bilateral knee pain right > left.   Thank you for this interesting consult.  I greatly enjoyed meeting Alison Mccarthy and look  forward to participating in their care.  A copy of this report was sent to the requesting provider on this date.  Electronically Signed: Mickie Kay, NP 08/10/2023, 2:24 PM   I spent a total of  40 Minutes   in face to face in clinical consultation, greater than 50% of which was counseling/coordinating care for right knee pain.

## 2023-08-11 ENCOUNTER — Ambulatory Visit
Admission: RE | Admit: 2023-08-11 | Discharge: 2023-08-11 | Disposition: A | Discharge status: Home / Self Care | Source: Ambulatory Visit | Attending: Interventional Radiology | Admitting: Interventional Radiology

## 2023-08-11 ENCOUNTER — Other Ambulatory Visit: Payer: Self-pay | Admitting: Interventional Radiology

## 2023-08-11 DIAGNOSIS — M1711 Unilateral primary osteoarthritis, right knee: Secondary | ICD-10-CM

## 2023-08-11 HISTORY — PX: IR RADIOLOGIST EVAL & MGMT: IMG5224

## 2023-08-20 ENCOUNTER — Other Ambulatory Visit: Payer: Self-pay | Admitting: Orthopaedic Surgery

## 2023-09-16 ENCOUNTER — Other Ambulatory Visit: Payer: Self-pay | Admitting: Orthopaedic Surgery

## 2023-09-30 ENCOUNTER — Other Ambulatory Visit: Payer: Self-pay | Admitting: Orthopaedic Surgery

## 2023-10-01 ENCOUNTER — Telehealth: Payer: Self-pay | Admitting: Physician Assistant

## 2023-10-01 ENCOUNTER — Other Ambulatory Visit: Payer: Self-pay | Admitting: Orthopaedic Surgery

## 2023-10-01 MED ORDER — CELECOXIB 200 MG PO CAPS: 200.0000 mg | ORAL_CAPSULE | Freq: Two times a day (BID) | ORAL | 3 refills | Status: DC | PRN

## 2023-10-01 NOTE — Telephone Encounter (Signed)
 Patient called and said she needs a refill on Celebrex . CB# [phone]

## 2024-01-19 ENCOUNTER — Other Ambulatory Visit: Payer: Self-pay | Admitting: Orthopaedic Surgery

## 2024-03-22 ENCOUNTER — Encounter: Payer: Self-pay | Admitting: Radiology
